# Patient Record
Sex: Female | Born: 1980
Health system: Southern US, Community
[De-identification: ages and names within clinical notes are randomized; demographics above are authoritative.]

## PROBLEM LIST (undated history)

## (undated) DIAGNOSIS — R102 Pelvic and perineal pain: Secondary | ICD-10-CM

## (undated) DIAGNOSIS — Z973 Presence of spectacles and contact lenses: Secondary | ICD-10-CM

## (undated) DIAGNOSIS — M1990 Unspecified osteoarthritis, unspecified site: Secondary | ICD-10-CM

## (undated) DIAGNOSIS — Z972 Presence of dental prosthetic device (complete) (partial): Secondary | ICD-10-CM

## (undated) HISTORY — PX: TUBAL LIGATION: SHX77

## (undated) HISTORY — PX: DILATION AND CURETTAGE OF UTERUS: SHX78

---

## 2002-11-06 HISTORY — PX: DILATION AND CURETTAGE OF UTERUS: SHX78

## 2002-12-08 ENCOUNTER — Emergency Department (HOSPITAL_COMMUNITY): Admission: EM | Admit: 2002-12-08 | Discharge: 2002-12-08 | Payer: Self-pay | Admitting: Emergency Medicine

## 2002-12-08 ENCOUNTER — Encounter: Payer: Self-pay | Admitting: Emergency Medicine

## 2003-02-06 ENCOUNTER — Encounter: Payer: Self-pay | Admitting: Emergency Medicine

## 2003-02-06 ENCOUNTER — Emergency Department (HOSPITAL_COMMUNITY): Admission: EM | Admit: 2003-02-06 | Discharge: 2003-02-06 | Payer: Self-pay | Admitting: Emergency Medicine

## 2003-02-09 ENCOUNTER — Ambulatory Visit (HOSPITAL_COMMUNITY): Admission: RE | Admit: 2003-02-09 | Discharge: 2003-02-09 | Payer: Self-pay | Admitting: *Deleted

## 2003-03-21 ENCOUNTER — Emergency Department (HOSPITAL_COMMUNITY): Admission: EM | Admit: 2003-03-21 | Discharge: 2003-03-22 | Payer: Self-pay | Admitting: Emergency Medicine

## 2003-03-22 ENCOUNTER — Encounter: Payer: Self-pay | Admitting: Emergency Medicine

## 2003-03-23 ENCOUNTER — Inpatient Hospital Stay (HOSPITAL_COMMUNITY): Admission: AD | Admit: 2003-03-23 | Discharge: 2003-03-23 | Payer: Self-pay | Admitting: Obstetrics and Gynecology

## 2003-03-23 ENCOUNTER — Encounter: Payer: Self-pay | Admitting: Obstetrics and Gynecology

## 2003-03-24 ENCOUNTER — Ambulatory Visit (HOSPITAL_COMMUNITY): Admission: AD | Admit: 2003-03-24 | Discharge: 2003-03-24 | Payer: Self-pay | Admitting: Obstetrics and Gynecology

## 2003-03-24 ENCOUNTER — Encounter (INDEPENDENT_AMBULATORY_CARE_PROVIDER_SITE_OTHER): Payer: Self-pay | Admitting: Specialist

## 2003-05-18 ENCOUNTER — Other Ambulatory Visit: Admission: RE | Admit: 2003-05-18 | Discharge: 2003-05-18 | Payer: Self-pay | Admitting: Obstetrics and Gynecology

## 2004-07-07 ENCOUNTER — Emergency Department (HOSPITAL_COMMUNITY): Admission: EM | Admit: 2004-07-07 | Discharge: 2004-07-07 | Payer: Self-pay | Admitting: Emergency Medicine

## 2004-09-06 ENCOUNTER — Emergency Department (HOSPITAL_COMMUNITY): Admission: EM | Admit: 2004-09-06 | Discharge: 2004-09-06 | Payer: Self-pay | Admitting: Emergency Medicine

## 2004-09-10 ENCOUNTER — Ambulatory Visit (HOSPITAL_COMMUNITY): Admission: RE | Admit: 2004-09-10 | Discharge: 2004-09-10 | Payer: Self-pay | Admitting: Obstetrics and Gynecology

## 2004-10-20 ENCOUNTER — Inpatient Hospital Stay (HOSPITAL_COMMUNITY): Admission: AD | Admit: 2004-10-20 | Discharge: 2004-10-20 | Payer: Self-pay | Admitting: Obstetrics and Gynecology

## 2004-11-24 ENCOUNTER — Other Ambulatory Visit: Admission: RE | Admit: 2004-11-24 | Discharge: 2004-11-24 | Payer: Self-pay | Admitting: Obstetrics and Gynecology

## 2004-12-28 ENCOUNTER — Inpatient Hospital Stay (HOSPITAL_COMMUNITY): Admission: AD | Admit: 2004-12-28 | Discharge: 2004-12-28 | Payer: Self-pay | Admitting: Obstetrics and Gynecology

## 2005-02-28 ENCOUNTER — Encounter: Admission: RE | Admit: 2005-02-28 | Discharge: 2005-02-28 | Payer: Self-pay | Admitting: Obstetrics and Gynecology

## 2005-03-22 ENCOUNTER — Inpatient Hospital Stay (HOSPITAL_COMMUNITY): Admission: AD | Admit: 2005-03-22 | Discharge: 2005-03-22 | Payer: Self-pay | Admitting: Obstetrics and Gynecology

## 2005-04-28 ENCOUNTER — Inpatient Hospital Stay (HOSPITAL_COMMUNITY): Admission: AD | Admit: 2005-04-28 | Discharge: 2005-04-28 | Payer: Self-pay | Admitting: *Deleted

## 2005-05-05 ENCOUNTER — Inpatient Hospital Stay (HOSPITAL_COMMUNITY): Admission: AD | Admit: 2005-05-05 | Discharge: 2005-05-05 | Payer: Self-pay | Admitting: Obstetrics and Gynecology

## 2005-05-08 ENCOUNTER — Inpatient Hospital Stay (HOSPITAL_COMMUNITY): Admission: AD | Admit: 2005-05-08 | Discharge: 2005-05-12 | Payer: Self-pay | Admitting: Obstetrics and Gynecology

## 2005-05-09 ENCOUNTER — Encounter (INDEPENDENT_AMBULATORY_CARE_PROVIDER_SITE_OTHER): Payer: Self-pay | Admitting: Specialist

## 2006-03-02 ENCOUNTER — Emergency Department (HOSPITAL_COMMUNITY): Admission: EM | Admit: 2006-03-02 | Discharge: 2006-03-02 | Payer: Self-pay | Admitting: Emergency Medicine

## 2006-04-08 ENCOUNTER — Inpatient Hospital Stay (HOSPITAL_COMMUNITY): Admission: AD | Admit: 2006-04-08 | Discharge: 2006-04-08 | Payer: Self-pay | Admitting: Obstetrics and Gynecology

## 2006-09-07 ENCOUNTER — Emergency Department (HOSPITAL_COMMUNITY): Admission: EM | Admit: 2006-09-07 | Discharge: 2006-09-07 | Payer: Self-pay | Admitting: Emergency Medicine

## 2006-09-17 ENCOUNTER — Emergency Department (HOSPITAL_COMMUNITY): Admission: EM | Admit: 2006-09-17 | Discharge: 2006-09-17 | Payer: Self-pay | Admitting: Emergency Medicine

## 2007-03-07 ENCOUNTER — Emergency Department (HOSPITAL_COMMUNITY): Admission: EM | Admit: 2007-03-07 | Discharge: 2007-03-07 | Payer: Self-pay | Admitting: Emergency Medicine

## 2007-03-30 ENCOUNTER — Emergency Department (HOSPITAL_COMMUNITY): Admission: EM | Admit: 2007-03-30 | Discharge: 2007-03-30 | Payer: Self-pay | Admitting: Emergency Medicine

## 2007-11-23 ENCOUNTER — Inpatient Hospital Stay (HOSPITAL_COMMUNITY): Admission: EM | Admit: 2007-11-23 | Discharge: 2007-11-25 | Payer: Self-pay | Admitting: Psychiatry

## 2007-11-25 ENCOUNTER — Ambulatory Visit: Payer: Self-pay | Admitting: Psychiatry

## 2009-06-22 ENCOUNTER — Encounter: Admission: RE | Admit: 2009-06-22 | Discharge: 2009-06-22 | Payer: Self-pay | Admitting: Obstetrics and Gynecology

## 2009-11-06 HISTORY — PX: TUBAL LIGATION: SHX77

## 2009-11-29 ENCOUNTER — Inpatient Hospital Stay (HOSPITAL_COMMUNITY): Admission: RE | Admit: 2009-11-29 | Discharge: 2009-12-01 | Payer: Self-pay | Admitting: Obstetrics and Gynecology

## 2009-11-29 ENCOUNTER — Encounter (INDEPENDENT_AMBULATORY_CARE_PROVIDER_SITE_OTHER): Payer: Self-pay | Admitting: Obstetrics and Gynecology

## 2011-01-22 LAB — BASIC METABOLIC PANEL
BUN: 6 mg/dL (ref 6–23)
CO2: 21 mEq/L (ref 19–32)
Calcium: 8.6 mg/dL (ref 8.4–10.5)
Chloride: 105 mEq/L (ref 96–112)
Creatinine, Ser: 0.65 mg/dL (ref 0.4–1.2)
GFR calc Af Amer: >60 mL/min (ref >60)
GFR calc non Af Amer: >60 mL/min (ref >60)
Glucose, Bld: 77 mg/dL (ref 70–99)
Potassium: 3.9 mEq/L (ref 3.5–5.1)
Sodium: 133 mEq/L — ABNORMAL LOW (ref 135–145)

## 2011-01-22 LAB — CBC
HCT: 29.8 % — ABNORMAL LOW (ref 36.0–46.0)
HCT: 34.1 % — ABNORMAL LOW (ref 36.0–46.0)
Hemoglobin: 11.4 g/dL — ABNORMAL LOW (ref 12.0–15.0)
Hemoglobin: 9.9 g/dL — ABNORMAL LOW (ref 12.0–15.0)
MCHC: 33.1 g/dL (ref 30.0–36.0)
MCHC: 33.3 g/dL (ref 30.0–36.0)
MCV: 89 fL (ref 78.0–100.0)
MCV: 89.1 fL (ref 78.0–100.0)
Platelets: 229 10*3/uL (ref 150–400)
Platelets: 267 10*3/uL (ref 150–400)
RBC: 3.34 MIL/uL — ABNORMAL LOW (ref 3.87–5.11)
RBC: 3.83 MIL/uL — ABNORMAL LOW (ref 3.87–5.11)
RDW: 14.2 % (ref 11.5–15.5)
RDW: 14.5 % (ref 11.5–15.5)
WBC: 11.1 10*3/uL — ABNORMAL HIGH (ref 4.0–10.5)
WBC: 9.3 10*3/uL (ref 4.0–10.5)

## 2011-01-22 LAB — CCBB MATERNAL DONOR DRAW

## 2011-01-22 LAB — RPR: RPR Ser Ql: NONREACTIVE

## 2011-01-22 LAB — GLUCOSE, CAPILLARY
Glucose-Capillary: 106 mg/dL — ABNORMAL HIGH (ref 70–99)
Glucose-Capillary: 89 mg/dL (ref 70–99)

## 2011-03-24 NOTE — Op Note (Signed)
Linda Hopkins, Linda Hopkins                          ACCOUNT NO.:  [id number]   MEDICAL RECORD NO.:  [id number]                   PATIENT TYPE:  AMB   LOCATION:  DFTL                                 [facility]ILITY:  [facility]   PHYSICIAN:  Osborn Coho, M.D.                DATE OF BIRTH:  06-01-1981   DATE OF PROCEDURE:  03/24/2003  DATE OF DISCHARGE:                                 OPERATIVE REPORT   PREOPERATIVE DIAGNOSIS:  Missed abortion.   POSTOPERATIVE DIAGNOSIS:  Missed abortion.   PROCEDURE:  Suction dilation and curettage.   ANESTHESIA:  MAC with paracervical block (2% lidocaine).   SURGEON:  Osborn Coho, M.D.   FLUIDS REPLACED:  1200 mL.   ESTIMATED BLOOD LOSS:  Minimal, less than 10 mL.   URINE OUTPUT:  Approximately 200 mL via straight catheterization prior to  procedure.   PATHOLOGY:  Significant products of conception sent.   COMPLICATIONS:  None.   DESCRIPTION OF PROCEDURE:  The patient was taken to the operating room after  the risks, benefits, and alternatives were discussed with the patient, the  patient verbalized understanding, and consent signed and witnessed.  The  patient was given Hopkins MAC per anesthesia and placed in the dorsal lithotomy  position and prepped and draped in the normal sterile fashion.  Lidocaine 2%  10 mL was administered for paracervical block while the anterior lip of the  cervix was grasped with Hopkins single-tooth tenaculum.  The cervix was dilated  for passage of Hopkins size 7 suction curette.  Prior to placement of the  speculum, an exam under anesthesia was performed and the uterus was noted to  be approximately seven to eight weeks' size.  After dilating the cervix, the  uterus was sounded to approximately 8 cm.  The size 7 suction curette was  placed in the uterus and suction curettage performed with significant amount  of products of conception returning.  Suction curettage was performed until  minimal tissue returned.  Sharp curettage was then  performed until Hopkins gritty  texture was noted.  Suction curettage was performed once again to remove any  remaining debris.  The count was correct.  Instruments were removed.  The  tenaculum sites were hemostatic.  The patient tolerated the procedure well  and was returned to the recovery room in stable condition.                                               Osborn Coho, M.D.    AR/MEDQ  D:  03/24/2003  T:  03/24/2003  Job:  [id number]

## 2011-03-24 NOTE — Op Note (Signed)
Linda Hopkins, Linda Hopkins                ACCOUNT NO.:  [id number]   MEDICAL RECORD NO.:  [id number]          PATIENT TYPE:  INP   LOCATION:  9142                          [facility]ILITY:  [facility]   PHYSICIAN:  Malachi Pro. Ambrose Mantle, M.D. DATE OF BIRTH:  1981/04/29   DATE OF PROCEDURE:  05/09/2005  DATE OF DISCHARGE:                                 OPERATIVE REPORT   PREOPERATIVE DIAGNOSES:  1.  Intrauterine pregnancy at 39+ weeks.  2.  Failure to progress in labor.  3.  Repetitive late decelerations.  4.  Probable chorioamnionitis.   POSTOPERATIVE DIAGNOSES:  1.  Intrauterine pregnancy at 39+ weeks.  2.  Failure to progress in labor.  3.  Repetitive late decelerations.  4.  Probable chorioamnionitis.   OPERATION:  Low transverse cervical C-section.   OPERATOR:  Malachi Pro. Ambrose Mantle, M.D.   ANESTHESIA:  Epidural.   The patient was brought to the operating room.  Fetal heart rate was in the  170s.  There were no decelerations in the operating room.  The epidural  anesthetic was boosted.  The fetal scalp electrode was removed.  A Foley  catheter was indwelling.  The abdomen was prepped with Betadine solution and  draped as a sterile field.  The patient had a tattoo on her lower abdomen  and I told her that I would make the incision slightly higher than the  tattoo.  The abdomen was then draped as a sterile field.  Anesthesia was  confirmed.  And the incision was made through the skin in a transverse  fashion, just above the tattoo kitty.  The incision was carried in layers  through the skin, subcutaneous tissue, and fascia.  The fascia was then  separated from the rectus muscles superiorly and inferiorly.  The rectus  muscle was splint in the midline.  The peritoneum was opened vertically.  An  incision was made into the lower uterine segment peritoneum and extended  laterally and then I made a small incision in the lower uterine segment with  the knife and then entered the amniotic cavity with my  finger.  Enlarged the  incision by pulling superiorly and inferiorly on the uterine incision.  The  infant easily delivered with fundal pressure.  The vertex delivered.  The  nose and pharynx were suctioned with a bulb and the remainder of the baby  was delivered easily.  Cord was clamped.  The infant was given to Dr. Alison Murray  who was in attendance.  The infant was a female, 7 pounds 5 ounces, Apgar's of  9 and 9 at 1 and 5 minutes.  A cord blood sample was obtained in case the pH  was necessary.  Routine cord blood studies were obtained.  The placenta was  removed.  The inside of the uterus was inspected and found to be free of any  debris.  The cervix was already dilated.  I closed the uterus in two layers  using a running lock suture of 0 Vicryl in the first layer, non-locking  suture with the same material on the second layer, and then a  couple of  extra figure-of-8 sutures were required for hemostasis.  Liberal irrigation  confirmed hemostasis.  The uterus, tubes, and ovaries looked normal.  The  gutters were blotted free of blood.  The peritoneum and rectus muscle were  re-approximated with interrupted sutures of 0 Vicryl in one layer.  The  fascia was then closed with two running sutures of 0 Vicryl.  The subcu space was irrigated and closed with a running 3-0 Vicryl and the  skin was closed with automatic staples.  The patient seemed to tolerate the  procedure well.  Blood loss was about 1,000 cc.  Sponge and needle counts  were correct.  And she was returned to recovery in satisfactory condition.       TFH/MEDQ  D:  05/10/2005  T:  05/10/2005  Job:  [id number]

## 2011-03-24 NOTE — Discharge Summary (Signed)
Linda Hopkins, Linda Hopkins                ACCOUNT NO.:  [id number]   MEDICAL RECORD NO.:  [id number]          PATIENT TYPE:  INP   LOCATION:  9142                          [facility]   PHYSICIAN:  Malachi Pro. Ambrose Mantle, M.D. DATE OF BIRTH:  1981/09/24   DATE OF ADMISSION:  05/08/2005  DATE OF DISCHARGE:                                 DISCHARGE SUMMARY   HOSPITAL COURSE:  A 30 year old white single female para 0 gravida 1  admitted for induction of labor because of her own insistence that she was  not going to leave the hospital without her baby. The patient's history and  physical and progress up to her C-section are detailed in the dictated  History and Physical. The patient underwent a C-section beginning on May 09, 2005 and ending on May 10, 2005 for a combination of failure to progress,  repetitive late decelerations, and probable chorioamnionitis. The procedure  was performed by Dr. Ambrose Mantle under epidural anesthesia, blood loss about 1000  mL; a female infant 7 pounds 5 ounces was delivered with Apgars of 9 at one  and 9 at five minutes. Postpartum, the patient did extremely well and was  discharged on the morning of the second or third postoperative day. She was  discharged 2 days after the surgery completed, 3 days after the baby was  born. Her staples were removed, strips were applied. The patient had  received Unasyn until she had been afebrile for about 32 hours. Her initial  hemoglobin was 12.8; hematocrit 38.8; white count 12,100; platelet count  335,000. Follow-up hemoglobin 10.1. RPR was nonreactive.   FINAL DIAGNOSES:  1.  Intrauterine pregnancy at 39+ weeks delivered vertex by cesarean      section.  2.  Failure to progress in labor.  3.  Repetitive late decelerations of the fetal heart rate.  4.  Probable chorioamnionitis.   OPERATION:  Low transverse cervical cesarean section.   FINAL CONDITION:  Improved.   INSTRUCTIONS:  Include our regular discharge instruction  booklet. The  patient is advised to return to the office in 10-14 days for follow-up  examination. The patient is given a prescription for Percocet 5/325 #24  tablets one q.4-6h. as needed for pain.       TFH/MEDQ  D:  05/12/2005  T:  05/12/2005  Job:  [id number]
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
## 2012-03-03 ENCOUNTER — Encounter (HOSPITAL_COMMUNITY): Payer: Self-pay | Admitting: Emergency Medicine

## 2012-03-03 ENCOUNTER — Emergency Department (HOSPITAL_COMMUNITY)
Admission: EM | Admit: 2012-03-03 | Discharge: 2012-03-04 | Disposition: A | Discharge status: Home / Self Care | Payer: Self-pay | Attending: Emergency Medicine | Admitting: Emergency Medicine

## 2012-03-03 DIAGNOSIS — R10819 Abdominal tenderness, unspecified site: Secondary | ICD-10-CM | POA: Insufficient documentation

## 2012-03-03 DIAGNOSIS — IMO0002 Reserved for concepts with insufficient information to code with codable children: Secondary | ICD-10-CM | POA: Insufficient documentation

## 2012-03-03 DIAGNOSIS — R109 Unspecified abdominal pain: Secondary | ICD-10-CM | POA: Insufficient documentation

## 2012-03-03 DIAGNOSIS — F17200 Nicotine dependence, unspecified, uncomplicated: Secondary | ICD-10-CM | POA: Insufficient documentation

## 2012-03-03 DIAGNOSIS — N949 Unspecified condition associated with female genital organs and menstrual cycle: Secondary | ICD-10-CM | POA: Insufficient documentation

## 2012-03-03 DIAGNOSIS — N72 Inflammatory disease of cervix uteri: Secondary | ICD-10-CM | POA: Insufficient documentation

## 2012-03-03 DIAGNOSIS — R110 Nausea: Secondary | ICD-10-CM | POA: Insufficient documentation

## 2012-03-03 LAB — URINALYSIS, ROUTINE W REFLEX MICROSCOPIC
Bilirubin Urine: NEGATIVE
Glucose, UA: NEGATIVE mg/dL
Hgb urine dipstick: NEGATIVE
Ketones, ur: NEGATIVE mg/dL
Leukocytes, UA: NEGATIVE
Nitrite: NEGATIVE
Protein, ur: NEGATIVE mg/dL
Specific Gravity, Urine: 1.029 (ref 1.005–1.030)
Urobilinogen, UA: 1 mg/dL (ref 0.0–1.0)
pH: 6.5 (ref 5.0–8.0)

## 2012-03-03 LAB — WET PREP, GENITAL

## 2012-03-03 LAB — POCT PREGNANCY, URINE: Preg Test, Ur: NEGATIVE

## 2012-03-03 MED ORDER — AZITHROMYCIN 250 MG PO TABS
1000.0000 mg | ORAL_TABLET | Freq: Once | ORAL | Status: AC
  Administered 2012-03-03: 1000 mg via ORAL
  Filled 2012-03-03: qty 4

## 2012-03-03 MED ORDER — LIDOCAINE HCL 1 % IJ SOLN
INTRAMUSCULAR | Status: AC
  Administered 2012-03-03: 20 mL
  Filled 2012-03-03: qty 20

## 2012-03-03 MED ORDER — DOXYCYCLINE HYCLATE 100 MG PO CAPS: 100.0000 mg | ORAL_CAPSULE | Freq: Two times a day (BID) | ORAL | Status: AC

## 2012-03-03 MED ORDER — LIDOCAINE HCL 2 % IJ SOLN
INTRAMUSCULAR | Status: AC
  Filled 2012-03-03: qty 1

## 2012-03-03 MED ORDER — CEFTRIAXONE SODIUM 250 MG IJ SOLR
250.0000 mg | Freq: Once | INTRAMUSCULAR | Status: AC
  Administered 2012-03-03: 250 mg via INTRAMUSCULAR
  Filled 2012-03-03: qty 250

## 2012-03-03 MED ORDER — FLUCONAZOLE 150 MG PO TABS
150.0000 mg | ORAL_TABLET | Freq: Once | ORAL | Status: AC
  Administered 2012-03-03: 150 mg via ORAL
  Filled 2012-03-03: qty 1

## 2012-03-03 NOTE — ED Provider Notes (Signed)
History     CSN: [id number]  Arrival date & time 03/03/12  1952   First MD Initiated Contact with Patient 03/03/12 2031      Chief Complaint  Patient presents with  . Abdominal Pain    HPI  History provided by the patient. Patient is a 31 year old female with prior history of cesarean section, tubal ligation in the brain cyst who presents with complaints of bilateral lower abdomen and pelvic pains for the past 2 weeks. Pain came on gradually and has been waxing and waning. Recently pain is more persistent and intense. Painful similar to prior symptoms of ovarian cyst. Patient's last menstrual period was 02/20/2012 and normal. Patient also reports having associated clear or thick malodorous vaginal discharge, and dyspareunia. She denies any vaginal bleeding. No dysuria, hematuria, urinary frequency. No flank pains, fever, chills, sweats or vomiting. Patient has taken Aleve and Midol for her symptoms without significant relief. She denies any other aggravating or alleviating factors.    History reviewed. No pertinent past medical history.  Past Surgical History  Procedure Date  . Cesarean section     x 2  . Dilation and curettage of uterus   . Tubal ligation     No family history on file.  History  Substance Use Topics  . Smoking status: Current Some Day Smoker  . Smokeless tobacco: Not on file  . Alcohol Use: No    OB History    Grav Para Term Preterm Abortions TAB SAB Ect Mult Living                  Review of Systems  Constitutional: Negative for fever, chills and appetite change.  Gastrointestinal: Positive for nausea and abdominal pain. Negative for vomiting, diarrhea and constipation.  Genitourinary: Positive for vaginal discharge, vaginal pain and pelvic pain. Negative for dysuria, frequency, hematuria, flank pain, vaginal bleeding and menstrual problem.    Allergies  Hydrocodone  Home Medications   Current Outpatient Rx  Name Route Sig Dispense Refill  .  IBUPROFEN 200 MG PO TABS Oral Take 200 mg by mouth every 6 (six) hours as needed. For pain relief      BP 109/75  Pulse 97  Temp(Src) 98.8 F (37.1 C) (Oral)  Resp 16  Ht 5\' 4"  (1.626 m)  Wt 160 lb (72.576 kg)  BMI 27.46 kg/m2  SpO2 100%  LMP 02/20/2012  Physical Exam  Nursing note and vitals reviewed. Constitutional: She is oriented to person, place, and time. She appears well-developed and well-nourished. No distress.  HENT:  Head: Normocephalic and atraumatic.  Cardiovascular: Normal rate and regular rhythm.   Pulmonary/Chest: Effort normal and breath sounds normal.  Abdominal: Soft. There is tenderness in the right lower quadrant, suprapubic area and left lower quadrant. There is no rebound, no guarding, no CVA tenderness and no tenderness at McBurney's point.  Genitourinary: Cervix exhibits motion tenderness, discharge and friability. Right adnexum displays tenderness. Right adnexum displays no mass. Left adnexum displays tenderness. Left adnexum displays no mass.       Chaperone was present. Pain over the cervix with slight friability. Mild clear discharge. Slight dryness the vaginal walls with erythema. Small amounts of thick or white discharge. Mild adnexa tenderness.  Neurological: She is alert and oriented to person, place, and time.  Skin: Skin is warm and dry. No rash noted.  Psychiatric: She has a normal mood and affect. Her behavior is normal.    ED Course  Procedures   Results for orders  placed during the hospital encounter of 03/03/12  URINALYSIS, ROUTINE W REFLEX MICROSCOPIC      Component Value Range   Color, Urine YELLOW  YELLOW    APPearance CLOUDY (*) CLEAR    Specific Gravity, Urine 1.029  1.005 - 1.030    pH 6.5  5.0 - 8.0    Glucose, UA NEGATIVE  NEGATIVE (mg/dL)   Hgb urine dipstick NEGATIVE  NEGATIVE    Bilirubin Urine NEGATIVE  NEGATIVE    Ketones, ur NEGATIVE  NEGATIVE (mg/dL)   Protein, ur NEGATIVE  NEGATIVE (mg/dL)   Urobilinogen, UA 1.0  0.0  - 1.0 (mg/dL)   Nitrite NEGATIVE  NEGATIVE    Leukocytes, UA NEGATIVE  NEGATIVE   POCT PREGNANCY, URINE      Component Value Range   Preg Test, Ur NEGATIVE  NEGATIVE   WET PREP, GENITAL      Component Value Range   Yeast Wet Prep HPF POC NONE SEEN  NONE SEEN    Trich, Wet Prep NONE SEEN  NONE SEEN    Clue Cells Wet Prep HPF POC FEW (*) NONE SEEN    WBC, Wet Prep HPF POC FEW (*) NONE SEEN        1. Cervicitis       MDM  8:30 PM patient seen and evaluated. Patient no acute distress.        Angus Seller, [place] 03/04/12 [phone]

## 2012-03-03 NOTE — ED Notes (Signed)
Pt c/o lower abd pain, low back pain and pain with intercourse x 2 weeks. Worse today.Clear, malodorous discharge, denies dysuria,denies hematuria.

## 2012-03-03 NOTE — Discharge Instructions (Signed)
You were seen and treated for possible yeast infection today. Your providers were also concerned for infection and irritation of your cervix. You've been prescribed additional antibiotics to take to help treat your symptoms. Please followup with an OB/GYN provider for continued evaluation and treatment. Return to emergency room for any worsening symptoms, fever, chills, persistent nausea vomiting.   Cervicitis Cervicitis is a soreness and swelling (inflammation) of the cervix. Your cervix is located at the bottom of your uterus which opens up to the vagina.  CAUSES   Sexually transmitted infections (STIs).   Allergic reaction.   Medicines or birth control devices that are put in the vagina.   Injury to the cervix.   Bacterial infections.  SYMPTOMS  There may be no symptoms. If symptoms occur, they may include:  Grey, white, yellow, or bad smelling vaginal discharge.   Pain or itching of the area outside the vagina.   Painful sexual intercourse.   Lower abdominal or lower back pain, especially during intercourse.   Frequent urination.   Abnormal vaginal bleeding between periods, after sexual intercourse, or after menopause.   Pressure or a heavy feeling in the pelvis.  DIAGNOSIS  Diagnosis is made after a pelvic exam. Other tests may include:  Examination of any discharge under a microscope (wet prep).   A Pap test.  TREATMENT  Treatment will depend on the cause of cervicitis. If it is caused by an STI, both you and your partner will need to be treated. Antibiotic medicines will be given. HOME CARE INSTRUCTIONS   Do not have sexual intercourse until your caregiver says it is okay.   Do not have sexual intercourse until your partner has been treated if your cervicitis is caused by an STI.   Take your antibiotics as directed. Finish them even if you start to feel better.  SEEK IMMEDIATE MEDICAL CARE IF:   Your symptoms come back.   You have a fever.   You experience  any problems that may be related to the medicine you are taking.  MAKE SURE YOU:   Understand these instructions.   Will watch your condition.   Will get help right away if you are not doing well or get worse.  Document Released: 10/23/2005 Document Revised: 10/12/2011 Document Reviewed: 05/22/2011 [facility] Patient Information 2012 [place], [place].   RESOURCE GUIDE  Dental Problems  Patients with Medicaid: [facility] [phone] [address]                                           [phone] [address] Phone:  [phone]                                                  Phone:  [phone]  If unable to pay or uninsured, contact:  [facility] or [facility]. to become qualified for the adult dental clinic.  Chronic Pain Problems Contact [facility]  [phone] Patients need to be referred by their primary care doctor.  Insufficient Money for Medicine Contact United Way:  call "211" or [facility] [phone].  No Primary Care Doctor Call [facility]  [phone] Other agencies that provide inexpensive medical care    [facility]  [phone]    [facility]  [phone]    [facility]  [phone]    [facility]  [phone]    [facility]  717-830-6232    [facility]  [phone]  Psychological Services [facility] Behavioral Health  [phone] [facility] Services  [phone] [facility] Mental Health   [phone] (emergency services ([phone])818-2133)  Substance Abuse Resources [facility]  [phone] [facility] [phone] [facility] [place] [phone] Floydene Flock [phone] Residential & Outpatient Substance Abuse Program  267-699-9104  Abuse/Neglect [facility] Child Abuse Hotline (905)250-4827 [facility] Child Abuse Hotline (208)553-1463 (After Hours)  Emergency Shelter [facility]  (737)323-8896  Maternity Homes [facility] (604)743-6656 [facility] (254)[phone]  MRSA Hotline #:   240-495-5473    [facility] Resources  [facility]     [employer]                          [facility] Dept. [address]                        [address]      [address]  Blondell Reveal Phone:  [phone]                                   Phone:  [phone]                 Phone:  [phone]  [facility] Phone:  [phone]  [place] Child Abuse Hotline 303-720-0469 [phone] (After Hours)

## 2012-03-04 LAB — GC/CHLAMYDIA PROBE AMP, GENITAL: GC Probe Amp, Genital: NEGATIVE

## 2012-03-04 NOTE — ED Provider Notes (Signed)
Medical screening examination/treatment/procedure(s) were performed by non-physician practitioner and as supervising physician I was immediately available for consultation/collaboration. Devoria Albe, MD, FACEP   Ward Givens, MD 03/04/12 [phone]

## 2012-03-04 NOTE — ED Notes (Signed)
Pt very upset we were not giving her scripts for free. Explained antibiotic is on $4 list. Pt states she can not afford med. Advised pt to try to follow up with SS for assistance.

## 2012-04-04 ENCOUNTER — Ambulatory Visit (INDEPENDENT_AMBULATORY_CARE_PROVIDER_SITE_OTHER): Payer: Self-pay | Admitting: Obstetrics & Gynecology

## 2012-04-04 ENCOUNTER — Encounter: Payer: Self-pay | Admitting: Obstetrics & Gynecology

## 2012-04-04 VITALS — BP 106/74 | HR 90 | Temp 98.7°F | Ht 63.0 in | Wt 142.0 lb

## 2012-04-04 DIAGNOSIS — N949 Unspecified condition associated with female genital organs and menstrual cycle: Secondary | ICD-10-CM

## 2012-04-04 DIAGNOSIS — N898 Other specified noninflammatory disorders of vagina: Secondary | ICD-10-CM

## 2012-04-04 DIAGNOSIS — R102 Pelvic and perineal pain: Secondary | ICD-10-CM | POA: Insufficient documentation

## 2012-04-04 DIAGNOSIS — A499 Bacterial infection, unspecified: Secondary | ICD-10-CM

## 2012-04-04 DIAGNOSIS — N760 Acute vaginitis: Secondary | ICD-10-CM

## 2012-04-04 DIAGNOSIS — B9689 Other specified bacterial agents as the cause of diseases classified elsewhere: Secondary | ICD-10-CM | POA: Insufficient documentation

## 2012-04-04 MED ORDER — METRONIDAZOLE 500 MG PO TABS: 500.0000 mg | ORAL_TABLET | Freq: Two times a day (BID) | ORAL | Status: AC

## 2012-04-04 MED ORDER — DICLOFENAC SODIUM 75 MG PO TBEC: 75.0000 mg | DELAYED_RELEASE_TABLET | Freq: Two times a day (BID) | ORAL | Status: DC

## 2012-04-04 NOTE — Assessment & Plan Note (Signed)
BV: Repeat wet prep. Treat with flagyl.

## 2012-04-04 NOTE — Patient Instructions (Signed)
Cheron,  Thank you very much for coming in today. Please do the following: 1. Stop motrin 2. Start diclofenac for pain 3. Take flagyl for BV (do not mix with alcohol) 4. F/u in 3 weeks to review ultrasound and plan of care.   Drs. Eudell Julian/Arnold

## 2012-04-04 NOTE — Assessment & Plan Note (Signed)
Pelvic pain: occuring daily, x 2 months, mild improvement with motrin. Pain concerning for endometriosis. History consistent with dysmenorrhea.  Plan: -pelvic ultrasound -stop motrin, start diclofenac -f/u in 3 weeks -if ultrasound normal, endometriosis still very likely treatment options include depo/OCPs and laparoscopy.

## 2012-04-04 NOTE — Progress Notes (Signed)
Subjective:     Patient ID: Linda Hopkins, female   DOB: [birth year], 31 y.o.   MRN: [id number]  HPI 31 yo F presents as an ED follow up with a complaint of 2 months of daily pelvic pain. She was evaluated for the pain in the ED on 03/03/12 and was treated for cystitis with doxycyline, azithromycin, rocephin and diflucan. Her pain has persisted despite of treatment. She reports bilateral pelvic pain, low back pain and pain with intercourse in all positions. She denies pelvic trauma, recent change in sexual partners, vomiting, fever, chills and weight loss.   Regarding menses, periods are fairly regular occuring monthly, last 5 days, painful associated with heavy flow and strong cramps. Menarche age 75.   OB History    Grav Para Term Preterm Abortions TAB SAB Ect Mult Living   3 2 2  1 1    2      Past Surgical History  Procedure Date  . Cesarean section     x 2  . Dilation and curettage of uterus   . Tubal ligation    Occasional (non-daily) smoker.   Review of Systems As per HPI     Objective:   Physical Exam BP 106/74  Pulse 90  Temp(Src) 98.7 F (37.1 C) (Oral)  Ht 5\' 3"  (1.6 m)  Wt 142 lb (64.411 kg)  BMI 25.15 kg/m2  LMP 03/25/2012 General appearance: alert, cooperative and no distress Abdomen: soft, non-tender; bowel sounds normal; no masses,  no organomegaly Pelvic: cervix normal in appearance, external genitalia normal, no cervical motion tenderness, rectovaginal septum normal, uterus normal size, shape, and consistency, vagina normal with scant thick white discharge,  L> R adnexal tenderness and uterine tenderness.     Assessment and Plan:   Pelvic pain: occuring daily, x 2 months, mild improvement with motrin. Pain concerning for endometriosis. History consistent with dysmenorrhea.  Plan: -pelvic ultrasound -stop motrin, start diclofenac -f/u in 3 weeks -if ultrasound normal, endometriosis still very likely treatment options include depo/OCPs and laparoscopy.    BV: Repeat wet prep. Treat with flagyl.

## 2012-04-05 LAB — WET PREP, GENITAL
Clue Cells Wet Prep HPF POC: NONE SEEN
Trich, Wet Prep: NONE SEEN

## 2012-04-10 ENCOUNTER — Ambulatory Visit (HOSPITAL_COMMUNITY)
Admission: RE | Admit: 2012-04-10 | Discharge: 2012-04-10 | Disposition: A | Discharge status: Home / Self Care | Payer: Self-pay | Source: Ambulatory Visit | Attending: Obstetrics & Gynecology | Admitting: Obstetrics & Gynecology

## 2012-04-10 DIAGNOSIS — R102 Pelvic and perineal pain: Secondary | ICD-10-CM

## 2012-04-10 DIAGNOSIS — N949 Unspecified condition associated with female genital organs and menstrual cycle: Secondary | ICD-10-CM | POA: Insufficient documentation

## 2012-05-13 ENCOUNTER — Ambulatory Visit: Payer: Self-pay | Admitting: Physician Assistant

## 2012-05-24 ENCOUNTER — Ambulatory Visit: Payer: Self-pay | Admitting: Obstetrics & Gynecology

## 2012-05-27 ENCOUNTER — Encounter: Payer: Self-pay | Admitting: Obstetrics & Gynecology

## 2012-05-27 ENCOUNTER — Ambulatory Visit (INDEPENDENT_AMBULATORY_CARE_PROVIDER_SITE_OTHER): Payer: Self-pay | Admitting: Obstetrics & Gynecology

## 2012-05-27 VITALS — BP 102/73 | HR 91 | Temp 98.7°F | Ht 65.0 in | Wt 142.6 lb

## 2012-05-27 DIAGNOSIS — N946 Dysmenorrhea, unspecified: Secondary | ICD-10-CM

## 2012-05-27 DIAGNOSIS — R102 Pelvic and perineal pain: Secondary | ICD-10-CM

## 2012-05-27 DIAGNOSIS — N949 Unspecified condition associated with female genital organs and menstrual cycle: Secondary | ICD-10-CM

## 2012-05-27 MED ORDER — NORGESTIM-ETH ESTRAD TRIPHASIC 0.18/0.215/0.25 MG-35 MCG PO TABS: 1.0000 | ORAL_TABLET | Freq: Every day | ORAL | Status: DC

## 2012-05-27 MED ORDER — DICLOFENAC SODIUM 75 MG PO TBEC: 75.0000 mg | DELAYED_RELEASE_TABLET | Freq: Two times a day (BID) | ORAL | Status: AC

## 2012-05-27 NOTE — Patient Instructions (Signed)
Pelvic Pain in Women, Generic  Pelvic pain may be constant or come and go. It may be mild or severe. It is important to tell your caregiver exactly where the pain is located, when and how it occurs, and if it is related to your menstrual periods or stress. We have not found a definite cause for your pelvic pain today and you may need follow-up testing and examination.  CAUSES    Sexually transmitted diseases (STDS) cause pelvic inflammatory disease (PID). This is one of the most common causes of pelvic pain. It is an infection of the female sexual organs.   Endometriosis - This is a condition where some of the inside lining of the uterus is growing in the pelvis and abdomen outside the uterus. Along with (chronic) pain, this can cause infertility.   Tubal pregnancy - This is a serious condition where the pregnancy has occurred in a fallopian tube. Rupture of the tube can bleed heavily and cause death if it is not found in time.   Interstitial cystitis is an inflammation of the bladder that causes pelvic pain. People with severe cases of IC may urinate as many as 60 times a day.   Fibroids: A small percentage of women have uterine fibroids (non-cancerous smooth muscle growths in the uterus). Fibroids do not always cause pain.   Fibromyalgia is a disorder with symptoms of widespread muscle pain, fatigue and multiple tender points on the body.   Dysmenorrhea is painful menstrual periods.   Mittlesmertz is pain with ovulation.   Pelvic congestive syndrome, is engorgement of the pelvic veins just before and during a menstrual period.   Cervical stenosis is when the opening of the cervix is too small and causes pain during menstruation.   Adenomyosis (a type of endometriosis) glands that line the inside of the uterus lying in the muscle layer of the uterus.   Intestinal problems such as irritable bowel syndrome colitis or ileitis.   Appendicitis.   Pelvic cancer. Usually the cancer has been there for awhile  before causing pain.   Bladder infection.   Cysts or ovarian tumors.   Kidney stone.   Psychological factors (stress, sexual abuse or depression).   IUD (intrauterine device).   Prolapse (falling down of the uterus).   Retroflexed uterus - the uterus is tipped too far backwards.   Muscle spasms of the pelvic muscles.   Muscular-skeletal problems of the back (herniated disc).  DIAGNOSIS    Your caregiver may order testing, such as:   Blood tests.   Cultures to test for infection.   Ultrasound.   Looking into the bladder with a metal tube with a light (cystoscopy).   Looking into the pelvis and abdomen with very small incisions through a metal tube with a light (laparoscopy).   Looking into the large intestine with a fibro-optic tube with a light (colonoscopy).   CT scan - a type of X-ray to view the internal organs of the pelvis and abdomen.   MRI - views the pelvic and abdominal organs with a magnetic machine.   Intravenous pyelogram - views the kidneys, ureter and bladder after injecting dye through the vein by X-rays.   Injecting barium into the large intestine to view the intestine with X-rays (barium enema).   Not all test results are available during your visit. If your test results are not back during the visit, make an appointment with your caregiver or the medical facility. It is important for you to follow up   on all of your test results.  TREATMENT   Treatment will depend on the cause of the pain, such as:   Medication, antibiotics, pain medication, muscle relaxants, anti-depression drugs, hormones or birth control pills.   Physical therapy.   Acupuncture.   Psychiatric counseling.   Nerve blocks.   Surgery.  HOME CARE INSTRUCTIONS    Finish all medication as prescribed. Incomplete treatment will put you at risk for sterility and tubal pregnancy if your caregiver feels your pain is caused by an infection.   Rest and eat a balanced diet with plenty of fluids.   If you do have an  infection, your recent sexual partners may need treatment even if they are symptom-free or have a negative culture or evaluation. You also need follow-up to make sure you are no longer infected.   Only take over-the-counter or prescription medicines for pain, discomfort or fever as directed by your caregiver.   Apply warm or cold compresses to the lower abdomen depending on which one helps the pain.   Avoid stressful situations that may cause the pain.   Group therapy is sometimes helpful.   Make sure to follow all instructions. Some of the conditions listed above can have very serious outcomes if you do not take the time to follow-up with your caregiver.  SEEK IMMEDIATE MEDICAL CARE IF:    There is heavier bleeding from the birth canal (vagina).   You develop increasing abdominal pain.   You feel lightheaded or pass out.   An unexplained oral temperature above 102 F (38.9 C) develops.   Any of the problems which brought you to us are getting worse.   You are being physically or sexually abused.   You have painful urination.   You are still having pain four hours after taking prescription pain medication.   You have uncontrolled diarrhea.   You have abnormal vaginal discharge.  Document Released: 09/19/2004 Document Revised: 10/12/2011 Document Reviewed: 10/20/2008  [employer] Patient Information 2012 [employer].

## 2012-05-27 NOTE — Progress Notes (Signed)
Patient ID: Linda Hopkins, female   DOB: [birth year], 31 y.o.   MRN: [id number]  Chief Complaint  Patient presents with  . Follow-up    [place]    HPI Linda Hopkins is a 31 y.o. female.  Long history of bilateral lower quadrant pain and lower back pain. She says it began after her first cesarean section in 2006 and was worse after her repeat cesarean section in 2011. At that time she had a tubal ligation. Over the last few months pain seems to be worse. She has dyspareunia. She has dysmenorrhea. She is urinating more frequently but she believes it's because she states she's been drinking more fluids. No urinary urgency or dysuria. HPI  No past medical history on file.  Past Surgical History  Procedure Date  . Cesarean section     x 2  . Dilation and curettage of uterus   . Tubal ligation     No family history on file.  Social History History  Substance Use Topics  . Smoking status: Former Games developer  . Smokeless tobacco: Not on file  . Alcohol Use: No    Allergies  Allergen Reactions  . Hydrocodone Nausea And Vomiting    Current Outpatient Prescriptions  Medication Sig Dispense Refill  . diclofenac (VOLTAREN) 75 MG EC tablet Take 1 tablet (75 mg total) by mouth 2 (two) times daily.  60 tablet  3  . Norgestimate-Ethinyl Estradiol Triphasic (TRI-SPRINTEC) 0.18/0.215/0.25 MG-35 MCG tablet Take 1 tablet by mouth daily.  1 Package  11  . DISCONTD: Norgestimate-Ethinyl Estradiol Triphasic (TRI-SPRINTEC) 0.18/0.215/0.25 MG-35 MCG tablet Take 1 tablet by mouth daily.  1 Package  11    Review of Systems Review of Systems  Constitutional: Negative for fever.  Gastrointestinal: Negative for nausea, constipation and abdominal distention.  Genitourinary: Positive for pelvic pain and dyspareunia. Negative for vaginal discharge, vaginal pain and menstrual problem.    Blood pressure 102/73, pulse 91, temperature 98.7 F (37.1 C), temperature source Oral, height 5\' 5"  (1.651 m), weight  142 lb 9.6 oz (64.683 kg), last menstrual period 04/10/2012.  Physical Exam Physical Exam  Constitutional: She appears well-developed and well-nourished.  Abdominal: Soft. She exhibits no mass. There is Tenderness: mild bilateral low abdomen.. There is no guarding.  Genitourinary:       deferred  Skin: Skin is warm and dry.  Psychiatric: She has a normal mood and affect. Her behavior is normal.    Data Reviewed  *RADIOLOGY REPORT*  Clinical Data: Pelvic pain. History of 2 previous C-section  surgeries, D&C. LMP 03/25/2012  TRANSABDOMINAL AND TRANSVAGINAL ULTRASOUND OF PELVIS  Technique: Both transabdominal and transvaginal ultrasound  examinations of the pelvis were performed. Transabdominal technique  was performed for global imaging of the pelvis including uterus,  ovaries, adnexal regions, and pelvic cul-de-sac.  Comparison: 05/05/2005  It was necessary to proceed with endovaginal exam following the  transabdominal exam to visualize the endometrial and adnexal  detail.  Findings:  Uterus: .The uterus is 8.8 x 4.1 x 5.2 cm. Uterus is retroflexed  and anteverted. C-section scar is noted.  Endometrium: 13.5 mm, normal in appearance.  Right ovary: 3.1 x 1.6 x 2.5 cm, normal appearance.  Left ovary: 3.1 x 2.3 x 2.4 cm, normal in appearance.  Other findings: No free fluid  IMPRESSION:  1. Normal pelvic ultrasound.  2. C-section scar identified.  Original Report Authenticated By: Patterson Hammersmith, M.D.        Assessment    Pelvic pain, dysmenorrhea  Plan    Voltaren 75 mg by mouth twice a day for pain Tri-Sprintec Return to clinic 3 months       Linda Hopkins 05/27/2012, 5:20 PM

## 2012-06-22 ENCOUNTER — Emergency Department (HOSPITAL_COMMUNITY): Payer: Self-pay

## 2012-06-22 ENCOUNTER — Emergency Department (HOSPITAL_COMMUNITY)
Admission: EM | Admit: 2012-06-22 | Discharge: 2012-06-22 | Disposition: A | Discharge status: Home / Self Care | Payer: Self-pay | Attending: Emergency Medicine | Admitting: Emergency Medicine

## 2012-06-22 ENCOUNTER — Encounter (HOSPITAL_COMMUNITY): Payer: Self-pay | Admitting: Emergency Medicine

## 2012-06-22 DIAGNOSIS — S92919A Unspecified fracture of unspecified toe(s), initial encounter for closed fracture: Secondary | ICD-10-CM | POA: Insufficient documentation

## 2012-06-22 DIAGNOSIS — IMO0002 Reserved for concepts with insufficient information to code with codable children: Secondary | ICD-10-CM | POA: Insufficient documentation

## 2012-06-22 MED ORDER — OXYCODONE-ACETAMINOPHEN 5-325 MG PO TABS: 1.0000 | ORAL_TABLET | ORAL | Status: AC | PRN

## 2012-06-22 NOTE — ED Provider Notes (Signed)
History     CSN: [id number]  Arrival date & time 06/22/12  1128   First MD Initiated Contact with Patient 06/22/12 1201      Chief Complaint  Patient presents with  . Toe Injury    (Consider location/radiation/quality/duration/timing/severity/associated sxs/prior treatment) HPI Comments: Pt presents with right 4th toe pain. Pt states she was getting ready for work when she accidentally hit her foot on the leg of the sofa. Pt was unable to put pressure on her right foot afterward and "hopped" to the car and was driven to the ED. Pain is in the right 4th toe, constant and throbbing in nature, and increases with weight bearing. Pt reports that her toe looks "crooked" as compared to usual. Pt denies any other pain or injury, swelling, weakness, numbness or tingling of the lower extremities.  The history is provided by the patient.    History reviewed. No pertinent past medical history.  Past Surgical History  Procedure Date  . Cesarean section     x 2  . Dilation and curettage of uterus   . Tubal ligation     No family history on file.  History  Substance Use Topics  . Smoking status: Former Games developer  . Smokeless tobacco: Never Used  . Alcohol Use: No    OB History    Grav Para Term Preterm Abortions TAB SAB Ect Mult Living   3 2 2  1 1    2       Review of Systems  Skin: Negative for color change and pallor.  Neurological: Negative for weakness and numbness.    Allergies  Hydrocodone  Home Medications   Current Outpatient Rx  Name Route Sig Dispense Refill  . DICLOFENAC SODIUM 75 MG PO TBEC Oral Take 1 tablet (75 mg total) by mouth 2 (two) times daily. 60 tablet 3  . NORGESTIM-ETH ESTRAD TRIPHASIC 0.18/0.215/0.25 MG-35 MCG PO TABS Oral Take 1 tablet by mouth daily. 1 Package 11    BP 112/65  Pulse 99  Temp 98.1 F (36.7 C) (Oral)  SpO2 99%  LMP 05/29/2012  Physical Exam  Nursing note and vitals reviewed. Constitutional: She appears well-developed and  well-nourished. No distress.  HENT:  Head: Normocephalic and atraumatic.  Neck: Neck supple.  Pulmonary/Chest: Effort normal.  Musculoskeletal:       Right ankle: Normal.       Right foot: She exhibits tenderness and bony tenderness. She exhibits normal capillary refill.       Feet:       Right foot sensation intact, capillary refill < 2 seconds in all digits.  Pt able to move all toes, slightly limited secondary to pain.    Neurological: She is alert.  Skin: She is not diaphoretic.    ED Course  Procedures (including critical care time)  Labs Reviewed - No data to display Dg Foot Complete Right  06/22/2012  *RADIOLOGY REPORT*  Clinical Data: Toe injury.  Hit corner of bed.   Pain in the fourth digit, fourth metatarsal.  Swelling, bruising.  RIGHT FOOT COMPLETE - 3+ VIEW  Comparison: None.  Findings: There is an oblique fracture of the fourth proximal phalanx.  There is associated soft tissue swelling and deformity of the toe.  No other fractures are identified.  IMPRESSION: Fracture of the fourth proximal phalanx.  Original Report Authenticated By: Patterson Hammersmith, M.D.     1. Closed fracture of proximal phalanx of toe       MDM  Pt  accidentally kicked a couch leg and sustained and fracture of her right 4th proximal phalanx of her foot.  Neurovascularly intact.  Pt placed in buddy tape and post-op shoe, crutches, d/c home with percocet for pain, PCP follow up. Discussed all results with patient.  Pt given return precautions.  Pt verbalizes understanding and agrees with plan.           [place], [place] 06/22/12 1314

## 2012-06-22 NOTE — ED Notes (Signed)
Wrapped toes on right foot around leg of sofa immediately PTA, painful and difficulty bearing weight.

## 2012-06-22 NOTE — ED Provider Notes (Signed)
Medical screening examination/treatment/procedure(s) were performed by non-physician practitioner and as supervising physician I was immediately available for consultation/collaboration. Davit Vassar, MD, FACEP   Darneshia Demary L Roosevelt Bisher, MD 06/22/12 1330 

## 2014-02-16 ENCOUNTER — Ambulatory Visit: Payer: Self-pay | Admitting: Internal Medicine

## 2014-02-16 VITALS — BP 110/60 | HR 85 | Temp 98.8°F | Resp 16 | Ht 62.0 in | Wt 139.0 lb

## 2014-02-16 DIAGNOSIS — N912 Amenorrhea, unspecified: Secondary | ICD-10-CM

## 2014-02-16 DIAGNOSIS — L0291 Cutaneous abscess, unspecified: Secondary | ICD-10-CM

## 2014-02-16 DIAGNOSIS — L0390 Cellulitis, unspecified: Secondary | ICD-10-CM

## 2014-02-16 LAB — POCT URINE PREGNANCY: PREG TEST UR: NEGATIVE

## 2014-02-16 MED ORDER — SULFAMETHOXAZOLE-TMP DS 800-160 MG PO TABS: 1.0000 | ORAL_TABLET | Freq: Two times a day (BID) | ORAL | Status: DC

## 2014-02-16 NOTE — Progress Notes (Signed)
Subjective:     Patient ID: Linda Hopkins, female   DOB: 09-02-1981, 33 y.o.   MRN: [id number]  HPI 33 YO caucasian female [facility] abscess that she noticed 3 days ago in her right axilla. The abscess is painful and is draining "white" fluid. She hasn't taken any medicine for this abscess or used any creams and it seems to be getting worse. In addition she also has not had her period in over 5 weeks and wants to make sure she is not pregnant. She is currently sexually active uses no contraception but had her tubes tied in 2011.   Review of Systems  Constitutional: Negative for fever, chills, diaphoresis, activity change and appetite change.  HENT: Negative for congestion, ear pain, postnasal drip and rhinorrhea.   Eyes: Negative for pain and itching.  Respiratory: Negative for cough, chest tightness and wheezing.   Cardiovascular: Negative for chest pain, palpitations and leg swelling.  Gastrointestinal: Negative for nausea, abdominal pain and diarrhea.  Genitourinary: Negative for dysuria.  Musculoskeletal: Negative for arthralgias, joint swelling and myalgias.       Abscess under right axilla       Objective:   Physical Exam  Constitutional: She is oriented to person, place, and time. She appears well-developed and well-nourished. No distress.  Cardiovascular: Normal rate, regular rhythm, normal heart sounds and intact distal pulses.  Exam reveals no gallop and no friction rub.   No murmur heard. Pulmonary/Chest: Effort normal and breath sounds normal. No respiratory distress. She has no wheezes. She has no rales. She exhibits no tenderness.  Abdominal: Soft. Bowel sounds are normal. She exhibits no distension. There is no tenderness. There is no rebound and no guarding.  Musculoskeletal:  Sl tender cyst with expressible purulent drainage under right axilla//no redness  Neurological: She is alert and oriented to person, place, and time.  Skin: She is not diaphoretic.        Assessment:    1) cyst axilla with slight infection/not cellulitis 2) ammenorhea     Plan:    1) Pt. Instructed to take bactrim ds 800160 mg 1 tablet BID #14, pt also instructed to use warm compresses to encourage drainage//f/u if red or not resolved    I have completed the patient encounter in its entirety as documented by [email or web address], with editing by me where necessary. Angelise Petrich P. Merla Richesoolittle, M.D.

## 2016-01-26 ENCOUNTER — Ambulatory Visit (INDEPENDENT_AMBULATORY_CARE_PROVIDER_SITE_OTHER): Payer: Managed Care, Other (non HMO) | Admitting: Family Medicine

## 2016-01-26 VITALS — BP 108/76 | HR 83 | Temp 98.0°F | Resp 16 | Ht 62.0 in | Wt 137.6 lb

## 2016-01-26 DIAGNOSIS — M778 Other enthesopathies, not elsewhere classified: Secondary | ICD-10-CM | POA: Diagnosis not present

## 2016-01-26 NOTE — Progress Notes (Signed)
This is a 35 year old woman who has several months of bilateral wrist pain radiating to her arms. 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 The symptoms began active before she started this job but gotten worse. The pain wakes her up at night.  Patient has no other joint complaints.  Objective: No acute distress BP 108/76 mmHg  Pulse 83  Temp(Src) 98 F (36.7 C) (Oral)  Resp 16  Ht 5\' 2"  (1.575 m)  Wt 137 lb 9.6 oz (62.415 kg)  BMI 25.16 kg/m2  SpO2 99%  LMP 01/25/2016 Examination of both wrists reveal normal contour all other is mild swelling along the brachioradialis tendon on the right. Patient is right-handed and has full range of motion of her wrists. She has some tingling in the palm of her right hand with negative Tinel's. She has no tenderness in the anatomical snuffbox on the right.  Assessment: Tendinitis from repetitive motion.  Plan: 2 weeks of splinting. I've asked her to take an anti-inflammatory every night with dinner.  I've asked her to return if the symptoms are either worsening or not resolving in 2 weeks.  Signed, Sheila OatsKurt Okie Jansson M.D.

## 2016-01-26 NOTE — Patient Instructions (Addendum)
  Please return if her symptoms are either worsening or not resolved by 2 weeks. Hopefully the splinting will make a difference while at work. He can take them off when you're sleeping.  I think it would help to take an anti-inflammatory with dinner every evening.   IF you received an x-ray today, you will receive an invoice from [facility]. Please contact [facility] at [phone] with questions or concerns regarding your invoice.   IF you received labwork today, you will receive an invoice from [facility]. Please contact [facility] at [phone] with questions or concerns regarding your invoice.   Our billing staff will not be able to assist you with questions regarding bills from these companies.  You will be contacted with the lab results as soon as they are available. The fastest way to get your results is to activate your My Chart account. Instructions are located on the last page of this paperwork. If you have not heard from us regarding the results in 2 weeks, please contact this office.

## 2017-12-12 ENCOUNTER — Other Ambulatory Visit: Payer: Self-pay | Admitting: Obstetrics and Gynecology

## 2017-12-12 DIAGNOSIS — N6320 Unspecified lump in the left breast, unspecified quadrant: Secondary | ICD-10-CM

## 2017-12-14 ENCOUNTER — Ambulatory Visit
Admission: RE | Admit: 2017-12-14 | Discharge: 2017-12-14 | Disposition: A | Discharge status: Home / Self Care | Payer: Managed Care, Other (non HMO) | Source: Ambulatory Visit | Attending: Obstetrics and Gynecology | Admitting: Obstetrics and Gynecology

## 2017-12-14 DIAGNOSIS — N6320 Unspecified lump in the left breast, unspecified quadrant: Secondary | ICD-10-CM

## 2019-04-07 ENCOUNTER — Telehealth: Payer: Self-pay | Admitting: *Deleted

## 2019-04-07 ENCOUNTER — Encounter: Payer: Self-pay | Admitting: Family Medicine

## 2019-04-07 ENCOUNTER — Other Ambulatory Visit: Payer: Self-pay

## 2019-04-07 ENCOUNTER — Encounter: Payer: Self-pay | Admitting: *Deleted

## 2019-04-07 ENCOUNTER — Ambulatory Visit (INDEPENDENT_AMBULATORY_CARE_PROVIDER_SITE_OTHER): Payer: Self-pay

## 2019-04-07 ENCOUNTER — Ambulatory Visit (INDEPENDENT_AMBULATORY_CARE_PROVIDER_SITE_OTHER): Payer: Self-pay | Admitting: Family Medicine

## 2019-04-07 VITALS — BP 110/60 | HR 113 | Temp 98.9°F | Resp 12 | Ht 62.0 in | Wt 141.0 lb

## 2019-04-07 DIAGNOSIS — M25551 Pain in right hip: Secondary | ICD-10-CM

## 2019-04-07 DIAGNOSIS — R000 Tachycardia, unspecified: Secondary | ICD-10-CM

## 2019-04-07 DIAGNOSIS — M545 Low back pain, unspecified: Secondary | ICD-10-CM

## 2019-04-07 LAB — POCT URINE PREGNANCY: Preg Test, Ur: NEGATIVE

## 2019-04-07 MED ORDER — KETOROLAC TROMETHAMINE 60 MG/2ML IM SOLN
60.0000 mg | Freq: Once | INTRAMUSCULAR | Status: AC
  Administered 2019-04-07: 60 mg via INTRAMUSCULAR
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
## 2019-04-08 ENCOUNTER — Telehealth: Payer: Self-pay

## 2019-04-08 NOTE — Telephone Encounter (Signed)
Discussed results with patient, patient expressed understanding. Nothing further needed. ° °

## 2019-04-08 NOTE — Telephone Encounter (Signed)
Patient called to receive her xray results and patient also stated that the Toradol injection did not help her right hip pain at all and patient wants to know next steps of what she needs to do to give her some relief.

## 2019-04-22 ENCOUNTER — Telehealth: Payer: Self-pay | Admitting: Family Medicine

## 2019-04-22 NOTE — Telephone Encounter (Signed)
Pt called and stated that she was seen in the office for hip pain and states that the shot she revived did not help at all and would like to know if some pain medication can be called in. Please advise    Pt would also like to know if a copy of her xray can be sent to her. Please advise

## 2019-04-23 NOTE — Telephone Encounter (Signed)
She can take OTC Ibuprofen 600 mg tid as needed with food. Sport medicine evaluation is an options and referral can be placed.  Thanks, BJ

## 2019-04-24 NOTE — Telephone Encounter (Signed)
Spoke with the patient. She is aware of Dr. Morrison Old message below. She would not like a referral at this time.

## 2019-08-29 ENCOUNTER — Ambulatory Visit (INDEPENDENT_AMBULATORY_CARE_PROVIDER_SITE_OTHER): Payer: BC Managed Care – PPO | Admitting: Family Medicine

## 2019-08-29 ENCOUNTER — Other Ambulatory Visit: Payer: Self-pay

## 2019-08-29 ENCOUNTER — Encounter: Payer: Self-pay | Admitting: Family Medicine

## 2019-08-29 VITALS — BP 98/64 | HR 70 | Temp 98.5°F | Resp 16 | Ht 62.0 in | Wt 136.0 lb

## 2019-08-29 DIAGNOSIS — Z1329 Encounter for screening for other suspected endocrine disorder: Secondary | ICD-10-CM | POA: Diagnosis not present

## 2019-08-29 DIAGNOSIS — R200 Anesthesia of skin: Secondary | ICD-10-CM

## 2019-08-29 DIAGNOSIS — E785 Hyperlipidemia, unspecified: Secondary | ICD-10-CM

## 2019-08-29 DIAGNOSIS — Z13228 Encounter for screening for other metabolic disorders: Secondary | ICD-10-CM

## 2019-08-29 DIAGNOSIS — Z0000 Encounter for general adult medical examination without abnormal findings: Secondary | ICD-10-CM

## 2019-08-29 DIAGNOSIS — Z23 Encounter for immunization: Secondary | ICD-10-CM

## 2019-08-29 DIAGNOSIS — Z130 Encounter for screening for diseases of the blood and blood-forming organs and certain disorders involving the immune mechanism: Secondary | ICD-10-CM

## 2019-08-29 LAB — CBC
HCT: 43.6 % (ref 36.0–46.0)
Hemoglobin: 14.6 g/dL (ref 12.0–15.0)
MCHC: 33.5 g/dL (ref 30.0–36.0)
MCV: 91.7 fl (ref 78.0–100.0)
Platelets: 329 10*3/uL (ref 150.0–400.0)
RBC: 4.75 Mil/uL (ref 3.87–5.11)
RDW: 13.6 % (ref 11.5–15.5)
WBC: 8 10*3/uL (ref 4.0–10.5)

## 2019-08-29 LAB — COMPREHENSIVE METABOLIC PANEL
ALT: 8 U/L (ref 0–35)
AST: 12 U/L (ref 0–37)
Albumin: 4.7 g/dL (ref 3.5–5.2)
Alkaline Phosphatase: 62 U/L (ref 39–117)
BUN: 15 mg/dL (ref 6–23)
CO2: 27 mEq/L (ref 19–32)
Calcium: 9.9 mg/dL (ref 8.4–10.5)
Chloride: 103 mEq/L (ref 96–112)
Creatinine, Ser: 1.03 mg/dL (ref 0.40–1.20)
GFR: 60.4 mL/min (ref >60.00)
Glucose, Bld: 84 mg/dL (ref 70–99)
Potassium: 4.2 mEq/L (ref 3.5–5.1)
Sodium: 137 mEq/L (ref 135–145)
Total Bilirubin: 0.4 mg/dL (ref 0.2–1.2)
Total Protein: 7 g/dL (ref 6.0–8.3)

## 2019-08-29 LAB — LIPID PANEL
Cholesterol: 206 mg/dL — ABNORMAL HIGH (ref 0–200)
HDL: 35.5 mg/dL — ABNORMAL LOW (ref >39.00)
LDL Cholesterol: 153 mg/dL — ABNORMAL HIGH (ref 0–99)
NonHDL: 170.76
Total CHOL/HDL Ratio: 6
Triglycerides: 91 mg/dL (ref 0.0–149.0)
VLDL: 18.2 mg/dL (ref 0.0–40.0)

## 2019-08-29 LAB — VITAMIN B12: Vitamin B-12: 157 pg/mL — ABNORMAL LOW (ref 211–911)

## 2019-08-29 LAB — TSH: TSH: 0.51 u[IU]/mL (ref 0.35–4.50)

## 2019-08-29 LAB — HEMOGLOBIN A1C: Hgb A1c MFr Bld: 5.2 % (ref 4.6–6.5)

## 2019-08-29 MED ORDER — GABAPENTIN 100 MG PO CAPS: 300.0000 mg | ORAL_CAPSULE | Freq: Every day | ORAL | 0 refills | Status: DC

## 2019-08-29 NOTE — Progress Notes (Signed)
HPI:   [name] is a 38 y.o. female, who is here today for her routine physical.  Last CPE: 08/2018.  Regular exercise 3 or more time per week: Not consistently but she considers herself "very active" through her job. Following a healthful diet: Not consistently, she eats what she wants. She lives with her husband and 2 children.  Chronic medical problems: Tobacco use,HLD.  Pap smear: 08/2018, she has an ointment with her gynecologist later this month.  There is no immunization history on file for this patient.  Mammogram: 12/2017 due to lesion found on examination, Bi-Rads 1.  Concerns today:  HLD: LDL has been in the 180's. She has not been consistent with following low fat diet.  RUE numbness like pain for about 4 years. Numbness extends from shoulder to hand.  Problem has been intermittently, started back to 3 weeks ago and it seems to be more frequent. Pain is worse at night, it wakes her up, 9/10. Last night it was 10/10.  She has not identified exacerbating. Movement seems to help. She denies cervical pain, edema,or erythema. Occasionally she is dropping objects she is holding with right hand. Right-handed.   Review of Systems  Constitutional: Negative for appetite change, fatigue and fever.  HENT: Negative for dental problem, hearing loss, mouth sores, sore throat, trouble swallowing and voice change.   Eyes: Negative for redness and visual disturbance.  Respiratory: Negative for cough, shortness of breath and wheezing.   Cardiovascular: Negative for chest pain and leg swelling.  Gastrointestinal: Negative for abdominal pain, nausea and vomiting.       No changes in bowel habits.  Endocrine: Negative for cold intolerance, heat intolerance, polydipsia, polyphagia and polyuria.  Genitourinary: Negative for decreased urine volume, dysuria, hematuria, vaginal bleeding and vaginal discharge.  Musculoskeletal: Negative for arthralgias, gait  problem and myalgias.  Skin: Negative for color change and rash.  Allergic/Immunologic: Negative for environmental allergies.  Neurological: Negative for syncope, weakness and headaches.  Hematological: Negative for adenopathy. Does not bruise/bleed easily.  Psychiatric/Behavioral: Negative for confusion and sleep disturbance. The patient is not nervous/anxious.   All other systems reviewed and are negative.   Current Outpatient Medications on File Prior to Visit  Medication Sig Dispense Refill  . ibuprofen (ADVIL) 600 MG tablet Take by mouth.     No current facility-administered medications on file prior to visit.    History reviewed. No pertinent past medical history.  Past Surgical History:  Procedure Laterality Date  . CESAREAN SECTION     x 2  . DILATION AND CURETTAGE OF UTERUS    . TUBAL LIGATION      Allergies  Allergen Reactions  . Hydrocodone Nausea And Vomiting    Family History  Problem Relation Age of Onset  . Breast cancer Maternal Grandfather     Social History   Socioeconomic History  . Marital status: Married    Spouse name: Not on file  . Number of children: 2  . Years of education: Not on file  . Highest education level: Not on file  Occupational History  . Not on file  Social Needs  . Financial resource strain: Not on file  . Food insecurity    Worry: Not on file    Inability: Not on file  . Transportation needs    Medical: Not on file    Non-medical: Not on file  Tobacco Use  . Smoking status: Current Some Day Smoker  . Smokeless  tobacco: Never Used  Substance and Sexual Activity  . Alcohol use: No  . Drug use: No  . Sexual activity: Yes  Lifestyle  . Physical activity    Days per week: Not on file    Minutes per session: Not on file  . Stress: Not on file  Relationships  . Social Musician on phone: Not on file    Gets together: Not on file    Attends religious service: Not on file    Active member of club or  organization: Not on file    Attends meetings of clubs or organizations: Not on file    Relationship status: Not on file  Other Topics Concern  . Not on file  Social History Narrative  . Not on file     Vitals:   08/29/19 1109  BP: 98/64  Pulse: 70  Resp: 16  Temp: 98.5 F (36.9 C)  SpO2: 98%   Body mass index is 24.87 kg/m.   Wt Readings from Last 3 Encounters:  08/29/19 136 lb (61.7 kg)  04/07/19 141 lb (64 kg)  01/26/16 137 lb 9.6 oz (62.4 kg)     Physical Exam  Nursing note and vitals reviewed. Constitutional: She is oriented to person, place, and time. She appears well-developed and well-nourished. No distress.  HENT:  Head: Normocephalic and atraumatic.  Right Ear: Hearing, tympanic membrane, external ear and ear canal normal.  Left Ear: Hearing, tympanic membrane, external ear and ear canal normal.  Mouth/Throat: Uvula is midline, oropharynx is clear and moist and mucous membranes are normal.  Eyes: Pupils are equal, round, and reactive to light. Conjunctivae and EOM are normal.  Neck: No tracheal deviation present. No thyromegaly present.  Cardiovascular: Normal rate and regular rhythm.  No murmur heard. Pulses:      Dorsalis pedis pulses are 2+ on the right side, and 2+ on the left side.  Respiratory: Effort normal and breath sounds normal. No respiratory distress.  GI: Soft. She exhibits no mass. There is no hepatomegaly. There is no tenderness.  Genitourinary:Comments: Deferred to gyn.  Musculoskeletal: She exhibits no edema. Cervical ROM normal. Some movements cause "discomfort." Right Phalen test positive.Bilateral Tinel negative. No major deformity or signs of synovitis appreciated.  Lymphadenopathy:    She has no cervical adenopathy.       Right: No supraclavicular adenopathy present.       Left: No supraclavicular adenopathy present.  Neurological: She is alert and oriented to person, place, and time. She has normal strength. No cranial nerve  deficit. Coordination and gait normal.  Reflex Scores:      Bicep reflexes are 2+ on the right side and 2+ on the left side.      Patellar reflexes are 2+ on the right side and 2+ on the left side. Skin: Skin is warm. No rash noted. No erythema.  Psychiatric: She has a normal mood and affect. Cognitive function grossly intact. Fairly groomed, good eye contact.   ASSESSMENT AND PLAN:  Ms. KATRYN PLUMMER was here today annual physical examination.   Orders Placed This Encounter  Procedures  . MR Cervical Spine Wo Contrast  . Tdap vaccine greater than or equal to 7yo IM  . CBC  . Lipid panel  . TSH  . Hemoglobin A1c  . Comprehensive metabolic panel  . Vitamin B12    Lab Results  Component Value Date   WBC 8.0 08/29/2019   HGB 14.6 08/29/2019   HCT 43.6  08/29/2019   MCV 91.7 08/29/2019   PLT 329.0 08/29/2019    Lab Results  Component Value Date   VITAMINB12 157 (L) 08/29/2019   Lab Results  Component Value Date   CREATININE 1.03 08/29/2019   BUN 15 08/29/2019   NA 137 08/29/2019   K 4.2 08/29/2019   CL 103 08/29/2019   CO2 27 08/29/2019   Lab Results  Component Value Date   ALT 8 08/29/2019   AST 12 08/29/2019   ALKPHOS 62 08/29/2019   BILITOT 0.4 08/29/2019   Lab Results  Component Value Date   CHOL 206 (H) 08/29/2019   HDL 35.50 (L) 08/29/2019   LDLCALC 153 (H) 08/29/2019   TRIG 91.0 08/29/2019   CHOLHDL 6 08/29/2019   Lab Results  Component Value Date   HGBA1C 5.2 08/29/2019   Lab Results  Component Value Date   TSH 0.51 08/29/2019     Routine general medical examination at a health care facility We discussed the importance of regular physical activity and healthy diet for prevention of chronic illness and/or complications. Preventive guidelines reviewed. Vaccination updated, refused influenza vaccine.  She will continue following with gynecologist for her female preventive care.  Next CPE in a year.  Hyperlipidemia, unspecified  hyperlipidemia type Low fat diet recommended for now. Further recommendations will be given according to results. She is not fasting.  -     Lipid panel  Right upper extremity numbness Possible etiologies discussed. Because problem has been going on for 4 years, cervical MRI will be arranged. Instructed about warning signs. Further recommendations will be given according to labs/imaging results.  Interfering with sleep. She agrees with trying Gabapentin, recommend titrating dose from 100 mg to 300 mg increasing dose every 7 days up to 300 mg at bedtime.  -     gabapentin (NEURONTIN) 100 MG capsule; Take 3 capsules (300 mg total) by mouth at bedtime.  Screening for endocrine, metabolic and immunity disorder -     Hemoglobin A1c -     Comprehensive metabolic panel  Need for diphtheria-tetanus-pertussis (Tdap) vaccine -     Tdap vaccine greater than or equal to 7yo IM    Return in 3 months (on 11/29/2019) for RUE numbness.    Zhane Bluitt G. [place], MD  [facility]. [place] office.

## 2019-08-29 NOTE — Patient Instructions (Addendum)
Today you have you routine preventive visit.  At least 150 minutes of moderate exercise per week, daily brisk walking for 15-30 min is a good exercise option. Healthy diet low in saturated (animal) fats and sweets and consisting of fresh fruits and vegetables, lean meats such as fish and white chicken and whole grains.  These are some of recommendations for screening depending of age and risk factors:  A few things to remember from today's visit:   Routine general medical examination at a health care facility  Hyperlipidemia, unspecified hyperlipidemia type - Plan: Lipid panel  Right upper extremity numbness - Plan: CBC, TSH, Vitamin B12, MR Cervical Spine Wo Contrast, gabapentin (NEURONTIN) 100 MG capsule  Screening for endocrine, metabolic and immunity disorder - Plan: Hemoglobin A1c, Comprehensive metabolic panel  Gabapentin 100 mg started today, increase dose to 2 capsules in 7 days and then 3 capsules in another 7 days.    - Vaccines:  Tdap vaccine every 10 years.  Given today.  Shingles vaccine recommended at age 73, could be given after 36 years of age but not sure about insurance coverage.   Pneumonia vaccines:  Prevnar 13 at 65 and Pneumovax at 59. Sometimes Pneumovax is giving earlier if history of smoking, lung disease,diabetes,kidney disease among some.    Screening for diabetes at age 55 and every 3 years.  Cervical cancer prevention:  Pap smear starts at 36 years of age and continues periodically until 36 years old in low risk women. Pap smear every 3 years between 72 and 71 years old. Pap smear every 3-5 years between women 15 and older if pap smear negative and HPV screening negative.   -Breast cancer: Mammogram: There is disagreement between experts about when to start screening in low risk asymptomatic female but recent recommendations are to start screening at 100 and not later than 36 years old , every 1-2 years and after 36 yo q 2 years. Screening is  recommended until 36 years old but some women can continue screening depending of healthy issues.  Colon cancer screening: starts at 36 years old until 36 years old.   Also recommended:  1. Dental visit- Brush and floss your teeth twice daily; visit your dentist twice a year. 2. Eye doctor- Get an eye exam at least every 2 years. 3. Helmet use- Always wear a helmet when riding a bicycle, motorcycle, rollerblading or skateboarding. 4. Safe sex- If you may be exposed to sexually transmitted infections, use a condom. 5. Seat belts- Seat belts can save your live; always wear one. 6. Smoke/Carbon Monoxide detectors- These detectors need to be installed on the appropriate level of your home. Replace batteries at least once a year. 7. Skin cancer- When out in the sun please cover up and use sunscreen 15 SPF or higher. 8. Violence- If anyone is threatening or hurting you, please tell your healthcare provider.  9. Drink alcohol in moderation- Limit alcohol intake to one drink or less per day. Never drink and drive.

## 2019-09-01 ENCOUNTER — Encounter: Payer: Self-pay | Admitting: Family Medicine

## 2019-09-03 NOTE — Progress Notes (Signed)
Patient is scheduled for 01/22 at 10 AM

## 2019-09-22 DIAGNOSIS — M5411 Radiculopathy, occipito-atlanto-axial region: Secondary | ICD-10-CM | POA: Diagnosis not present

## 2019-09-22 DIAGNOSIS — M531 Cervicobrachial syndrome: Secondary | ICD-10-CM | POA: Diagnosis not present

## 2019-09-22 DIAGNOSIS — G44301 Post-traumatic headache, unspecified, intractable: Secondary | ICD-10-CM | POA: Diagnosis not present

## 2019-09-22 DIAGNOSIS — M5412 Radiculopathy, cervical region: Secondary | ICD-10-CM | POA: Diagnosis not present

## 2019-09-23 DIAGNOSIS — G44301 Post-traumatic headache, unspecified, intractable: Secondary | ICD-10-CM | POA: Diagnosis not present

## 2019-09-23 DIAGNOSIS — M5411 Radiculopathy, occipito-atlanto-axial region: Secondary | ICD-10-CM | POA: Diagnosis not present

## 2019-09-23 DIAGNOSIS — M531 Cervicobrachial syndrome: Secondary | ICD-10-CM | POA: Diagnosis not present

## 2019-09-23 DIAGNOSIS — M5412 Radiculopathy, cervical region: Secondary | ICD-10-CM | POA: Diagnosis not present

## 2019-09-24 DIAGNOSIS — M5411 Radiculopathy, occipito-atlanto-axial region: Secondary | ICD-10-CM | POA: Diagnosis not present

## 2019-09-24 DIAGNOSIS — M531 Cervicobrachial syndrome: Secondary | ICD-10-CM | POA: Diagnosis not present

## 2019-09-24 DIAGNOSIS — M5412 Radiculopathy, cervical region: Secondary | ICD-10-CM | POA: Diagnosis not present

## 2019-09-24 DIAGNOSIS — G44301 Post-traumatic headache, unspecified, intractable: Secondary | ICD-10-CM | POA: Diagnosis not present

## 2019-09-25 DIAGNOSIS — M5411 Radiculopathy, occipito-atlanto-axial region: Secondary | ICD-10-CM | POA: Diagnosis not present

## 2019-09-25 DIAGNOSIS — G44301 Post-traumatic headache, unspecified, intractable: Secondary | ICD-10-CM | POA: Diagnosis not present

## 2019-09-25 DIAGNOSIS — M5412 Radiculopathy, cervical region: Secondary | ICD-10-CM | POA: Diagnosis not present

## 2019-09-25 DIAGNOSIS — M531 Cervicobrachial syndrome: Secondary | ICD-10-CM | POA: Diagnosis not present

## 2019-09-26 ENCOUNTER — Other Ambulatory Visit: Payer: Self-pay

## 2019-09-26 ENCOUNTER — Ambulatory Visit
Admission: RE | Admit: 2019-09-26 | Discharge: 2019-09-26 | Disposition: A | Discharge status: Home / Self Care | Payer: BC Managed Care – PPO | Source: Ambulatory Visit | Attending: Family Medicine | Admitting: Family Medicine

## 2019-09-26 DIAGNOSIS — R200 Anesthesia of skin: Secondary | ICD-10-CM

## 2019-09-26 DIAGNOSIS — M4802 Spinal stenosis, cervical region: Secondary | ICD-10-CM | POA: Diagnosis not present

## 2019-09-29 ENCOUNTER — Telehealth: Payer: Self-pay

## 2019-09-29 DIAGNOSIS — G44301 Post-traumatic headache, unspecified, intractable: Secondary | ICD-10-CM | POA: Diagnosis not present

## 2019-09-29 DIAGNOSIS — M5412 Radiculopathy, cervical region: Secondary | ICD-10-CM | POA: Diagnosis not present

## 2019-09-29 DIAGNOSIS — M531 Cervicobrachial syndrome: Secondary | ICD-10-CM | POA: Diagnosis not present

## 2019-09-29 DIAGNOSIS — M5411 Radiculopathy, occipito-atlanto-axial region: Secondary | ICD-10-CM | POA: Diagnosis not present

## 2019-09-29 NOTE — Telephone Encounter (Signed)
Pt states the nurse this morning told her that her MRI results were back, but couldn't give them to her.  Pt states she has been waiting all day for a call back about this.  Pt would like a call by end of day with results.

## 2019-09-29 NOTE — Telephone Encounter (Signed)
Please advise. I do not see any result notes on this. 

## 2019-09-29 NOTE — Telephone Encounter (Signed)
Copied from [place] [phone]. Topic: General - Other >> Sep 29, 2019 12:05 PM Carolyn Stare wrote: Pt call to fup on her MRI results, would like a call back

## 2019-09-29 NOTE — Telephone Encounter (Signed)
Message has been sent to Dr. Martinique. Awaiting a reply.

## 2019-09-30 ENCOUNTER — Encounter: Payer: Self-pay | Admitting: Family Medicine

## 2019-09-30 ENCOUNTER — Telehealth (INDEPENDENT_AMBULATORY_CARE_PROVIDER_SITE_OTHER): Payer: BC Managed Care – PPO | Admitting: Family Medicine

## 2019-09-30 ENCOUNTER — Other Ambulatory Visit: Payer: Self-pay

## 2019-09-30 DIAGNOSIS — E538 Deficiency of other specified B group vitamins: Secondary | ICD-10-CM | POA: Diagnosis not present

## 2019-09-30 DIAGNOSIS — M5412 Radiculopathy, cervical region: Secondary | ICD-10-CM

## 2019-09-30 DIAGNOSIS — G8929 Other chronic pain: Secondary | ICD-10-CM

## 2019-09-30 MED ORDER — DULOXETINE HCL 30 MG PO CPEP: 30.0000 mg | ORAL_CAPSULE | Freq: Every day | ORAL | 1 refills | Status: DC

## 2019-09-30 NOTE — Telephone Encounter (Signed)
Patient would like clarity regarding results and states she would like a call back by 9am, patient wanted to make Practice Admin aware this is her 3x calling, please advise

## 2019-09-30 NOTE — Telephone Encounter (Signed)
Patient called [facility] and is very "irate" that someone has not called her back regarding her results. She has asked for a call back multiple times and has yet to receive one. She does not want to receive results on My Chart she wants to speak with someone directly. There have been several messages conveying this request.   Please contact patient ASAP to review results.

## 2019-09-30 NOTE — Telephone Encounter (Signed)
Spoke with patient. She has been scheduled for 3:00 this afternoon. The patient stated that she wanted to speak with Dr. Martinique about her lab results and she wanted to speak with her now. I explained to the patient that Dr. Martinique was in clinic with other patients and that an appointment was made for her for 3:00 and Dr. Martinique would go over her lab results then. The patient stated that this was unacceptable, she should not have to wait to speak with Dr. Martinique and if she does not receive a call at 3:00 she is calling her lawyer.

## 2019-09-30 NOTE — Telephone Encounter (Signed)
Please arrange virtual visit to discuss results. Thanks, BJ

## 2019-09-30 NOTE — Progress Notes (Signed)
Virtual Visit via Video Note   I connected with Ms Dace on 09/30/19 by a video enabled telemedicine application and verified that I am speaking with the correct person using two identifiers.  Location patient: home Location provider:work office Persons participating in the virtual visit: patient, provider  I discussed the limitations of evaluation and management by telemedicine and the availability of in person appointments. The patient expressed understanding and agreed to proceed.   HPI: Ms Friis is a 38 yo female who I am seeing today to go through lab and imaging results and decide plan of treatment. She was last seen on 08/29/2019, when she was complaining about right upper extremity numbness/pain.  Problem has been going on for about 4 years and getting worse. Pain is interfering with sleep, 9-10/10. I recommended gabapentin, which she was instructed to titrate up from 100 mg to 300 mg, medication has not helped at all. No new associated symptoms. Blood work was done:  Materials engineer Value Date   VITAMINB12 157 (L) 08/29/2019   Cervical MRI done on 09/26/2019 showed:Diffuse disc protrusion at C5-6, more prominent on the left than the right. Disc protrusion extends into the left foramen causing moderate to severe left foraminal encroachment. Mild associated spurring. Disc protrusion on the right also cause causes moderate to severe right foraminal encroachment. Mild spinal stenosis at C5-6 Small central disc protrusion C3-4 without stenosis.  ROS: See pertinent positives and negatives per HPI.  No past medical history on file.  Past Surgical History:  Procedure Laterality Date  . CESAREAN SECTION     x 2  . DILATION AND CURETTAGE OF UTERUS    . TUBAL LIGATION      Family History  Problem Relation Age of Onset  . Breast cancer Maternal Grandfather     Social History   Socioeconomic History  . Marital status: Married    Spouse name: Not on file  . Number  of children: 2  . Years of education: Not on file  . Highest education level: Not on file  Occupational History  . Not on file  Social Needs  . Financial resource strain: Not on file  . Food insecurity    Worry: Not on file    Inability: Not on file  . Transportation needs    Medical: Not on file    Non-medical: Not on file  Tobacco Use  . Smoking status: Current Some Day Smoker  . Smokeless tobacco: Never Used  Substance and Sexual Activity  . Alcohol use: No  . Drug use: No  . Sexual activity: Yes  Lifestyle  . Physical activity    Days per week: Not on file    Minutes per session: Not on file  . Stress: Not on file  Relationships  . Social Herbalist on phone: Not on file    Gets together: Not on file    Attends religious service: Not on file    Active member of club or organization: Not on file    Attends meetings of clubs or organizations: Not on file    Relationship status: Not on file  . Intimate partner violence    Fear of current or ex partner: Not on file    Emotionally abused: Not on file    Physically abused: Not on file    Forced sexual activity: Not on file  Other Topics Concern  . Not on file  Social History Narrative  . Not on file  Current Outpatient Medications:  .  gabapentin (NEURONTIN) 100 MG capsule, Take 3 capsules (300 mg total) by mouth at bedtime., Disp: 90 capsule, Rfl: 0 .  ibuprofen (ADVIL) 600 MG tablet, Take by mouth., Disp: , Rfl:   EXAM:  VITALS per patient if applicable:  GENERAL: alert, oriented, appears well and in no acute distress  HEENT: atraumatic, conjunctiva clear, no obvious abnormalities on inspection of external nose and ears  NECK: normal movements of the head and neck  LUNGS: on inspection no signs of respiratory distress, breathing rate appears normal, no obvious gross SOB, gasping or wheezing  CV: no obvious cyanosis  MS: moves all visible extremities without noticeable  abnormality  PSYCH/NEURO: pleasant and cooperative, no obvious depression or anxiety, speech and thought processing grossly intact  ASSESSMENT AND PLAN:  Discussed the following assessment and plan:  Chronic radicular cervical pain - Plan: Ambulatory referral to Orthopedic Surgery, DULoxetine (CYMBALTA) 30 MG capsule Discussed cervical MRI in detail, questions answered. Gabapentin has not helped, so recommend discontinuing it. She agrees with trying Cymbalta 30 mg daily, dose can be increased to 60 mg in 4 to 6 weeks. Ortho referral placed. Instructed about warning signs.  B12 deficiency Recommend B12 100 mcg IM weekly x4, then she can continue oral supplementation. Explained that I am not sure about insurance coverage for injections here in the office.  She would like to arrange nurse visit for B12 injection and will call her insurance to find out if this is covered, otherwise she will continue giving B12 herself.   We also discussed rest lab results.  I discussed the assessment and treatment plan with the patient. She was provided an opportunity to ask questions and all were answered. She agreed with the plan and demonstrated an understanding of the instructions.   The patient was advised to call back or seek an in-person evaluation if the symptoms worsen or if the condition fails to improve as anticipated.  Return for Needs nurse visit for B12 injection..     [place], MD

## 2019-09-30 NOTE — Telephone Encounter (Signed)
Spoke with patient who was very rude an irrate and demands a call from Dr. Martinique by lunch time or "she will call her lawyer." Patient reports she is confused as to why PCP is saying her left side as she is having pain in her right side.

## 2019-09-30 NOTE — Telephone Encounter (Signed)
Results were sent to mychart. Patient is aware.

## 2019-10-08 DIAGNOSIS — M5412 Radiculopathy, cervical region: Secondary | ICD-10-CM | POA: Diagnosis not present

## 2019-10-08 DIAGNOSIS — G44301 Post-traumatic headache, unspecified, intractable: Secondary | ICD-10-CM | POA: Diagnosis not present

## 2019-10-08 DIAGNOSIS — M5411 Radiculopathy, occipito-atlanto-axial region: Secondary | ICD-10-CM | POA: Diagnosis not present

## 2019-10-08 DIAGNOSIS — M531 Cervicobrachial syndrome: Secondary | ICD-10-CM | POA: Diagnosis not present

## 2019-10-22 ENCOUNTER — Encounter: Payer: Self-pay | Admitting: Family Medicine

## 2019-10-24 ENCOUNTER — Ambulatory Visit: Payer: BC Managed Care – PPO | Admitting: Orthopaedic Surgery

## 2019-10-29 ENCOUNTER — Other Ambulatory Visit: Payer: Self-pay

## 2019-10-29 ENCOUNTER — Encounter: Payer: Self-pay | Admitting: Family Medicine

## 2019-10-29 DIAGNOSIS — G8929 Other chronic pain: Secondary | ICD-10-CM

## 2019-10-29 DIAGNOSIS — M5412 Radiculopathy, cervical region: Secondary | ICD-10-CM

## 2019-10-29 MED ORDER — DULOXETINE HCL 30 MG PO CPEP: 30.0000 mg | ORAL_CAPSULE | Freq: Every day | ORAL | 0 refills | Status: DC

## 2019-10-30 ENCOUNTER — Encounter: Payer: Self-pay | Admitting: Family Medicine

## 2019-11-14 ENCOUNTER — Ambulatory Visit: Payer: BC Managed Care – PPO | Admitting: Orthopaedic Surgery

## 2019-11-21 ENCOUNTER — Encounter: Payer: Self-pay | Admitting: Orthopaedic Surgery

## 2019-11-21 ENCOUNTER — Ambulatory Visit (INDEPENDENT_AMBULATORY_CARE_PROVIDER_SITE_OTHER): Payer: BC Managed Care – PPO

## 2019-11-21 ENCOUNTER — Ambulatory Visit: Payer: BC Managed Care – PPO | Admitting: Family Medicine

## 2019-11-21 ENCOUNTER — Other Ambulatory Visit: Payer: Self-pay

## 2019-11-21 ENCOUNTER — Ambulatory Visit (INDEPENDENT_AMBULATORY_CARE_PROVIDER_SITE_OTHER): Payer: BC Managed Care – PPO | Admitting: Orthopaedic Surgery

## 2019-11-21 VITALS — BP 99/69 | HR 93 | Ht 63.0 in | Wt 141.6 lb

## 2019-11-21 DIAGNOSIS — M4802 Spinal stenosis, cervical region: Secondary | ICD-10-CM

## 2019-11-21 DIAGNOSIS — M4722 Other spondylosis with radiculopathy, cervical region: Secondary | ICD-10-CM

## 2019-11-21 DIAGNOSIS — M542 Cervicalgia: Secondary | ICD-10-CM | POA: Diagnosis not present
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
## 2019-11-25 DIAGNOSIS — M4802 Spinal stenosis, cervical region: Secondary | ICD-10-CM | POA: Insufficient documentation

## 2019-11-25 DIAGNOSIS — M4722 Other spondylosis with radiculopathy, cervical region: Secondary | ICD-10-CM | POA: Insufficient documentation

## 2019-11-28 ENCOUNTER — Ambulatory Visit: Payer: BC Managed Care – PPO | Admitting: Family Medicine

## 2020-01-19 ENCOUNTER — Telehealth: Payer: Self-pay | Admitting: Orthopaedic Surgery

## 2020-01-19 NOTE — Telephone Encounter (Signed)
FYI

## 2020-01-19 NOTE — Telephone Encounter (Signed)
Patient seen 11-21-19 for neck pain and surgery sheet completed by Dr. Ophelia Charter. At the time patient was seen, elective surgery cases had not resumed.  I called patient today to discuss a surgery date.  She says she still has problems with the neck, but is not ready to have surgery.  She has my name and direct number if and when she decides to move forward with c5-6 fusion.

## 2020-02-03 ENCOUNTER — Other Ambulatory Visit: Payer: Self-pay

## 2020-02-03 ENCOUNTER — Emergency Department (INDEPENDENT_AMBULATORY_CARE_PROVIDER_SITE_OTHER)
Admission: EM | Admit: 2020-02-03 | Discharge: 2020-02-03 | Disposition: A | Discharge status: Home / Self Care | Payer: BC Managed Care – PPO | Source: Home / Self Care

## 2020-02-03 ENCOUNTER — Ambulatory Visit (INDEPENDENT_AMBULATORY_CARE_PROVIDER_SITE_OTHER)
Admission: RE | Admit: 2020-02-03 | Discharge: 2020-02-03 | Disposition: A | Discharge status: Home / Self Care | Payer: BC Managed Care – PPO | Source: Ambulatory Visit

## 2020-02-03 DIAGNOSIS — R6883 Chills (without fever): Secondary | ICD-10-CM | POA: Diagnosis not present

## 2020-02-03 DIAGNOSIS — R110 Nausea: Secondary | ICD-10-CM

## 2020-02-03 DIAGNOSIS — R6889 Other general symptoms and signs: Secondary | ICD-10-CM | POA: Diagnosis not present

## 2020-02-03 DIAGNOSIS — Z20822 Contact with and (suspected) exposure to covid-19: Secondary | ICD-10-CM

## 2020-02-03 DIAGNOSIS — R197 Diarrhea, unspecified: Secondary | ICD-10-CM

## 2020-02-03 DIAGNOSIS — A084 Viral intestinal infection, unspecified: Secondary | ICD-10-CM

## 2020-02-03 LAB — POCT URINALYSIS DIP (MANUAL ENTRY)
Bilirubin, UA: NEGATIVE
Glucose, UA: NEGATIVE mg/dL
Ketones, POC UA: NEGATIVE mg/dL
Leukocytes, UA: NEGATIVE
Nitrite, UA: NEGATIVE
Protein Ur, POC: NEGATIVE mg/dL
Spec Grav, UA: 1.025 (ref 1.010–1.025)
Urobilinogen, UA: 0.2 E.U./dL
pH, UA: 6 (ref 5.0–8.0)

## 2020-02-03 LAB — POCT INFLUENZA A/B
Influenza A, POC: NEGATIVE
Influenza B, POC: NEGATIVE

## 2020-02-03 LAB — POCT URINE PREGNANCY: Preg Test, Ur: NEGATIVE

## 2020-02-03 MED ORDER — ONDANSETRON HCL 4 MG PO TABS: 4.0000 mg | ORAL_TABLET | Freq: Four times a day (QID) | ORAL | 0 refills | Status: DC

## 2020-02-03 NOTE — Discharge Instructions (Signed)
Declines COVID test at this time Will go to [place] location to have rapid flu and urine checked.  Someone will follow up with you regarding abnormal results.    In the meantime: You should remain isolated in your home for 10 days from symptom onset AND greater than 72 hours after symptoms resolution (absence of fever without the use of fever-reducing medication and improvement in respiratory symptoms), whichever is longer Get plenty of rest and push fluids Supplement with OTC Pedialyte at needed Progress diet as tolerated Zofran prescribed.  Use as needed for nausea Use OTC medications like ibuprofen or tylenol as needed fever or pain Follow up in person or go to the ER such as fever, chills, vomiting, persistent diarrhea, bloody or dark tarry stools, constipation, urinary symptoms, worsening abdominal discomfort, symptoms that do not improve with medications, inability to keep fluids down, etc..[name]

## 2020-02-03 NOTE — ED Provider Notes (Signed)
[facility]    Virtual Visit via Video Note:  [name]  initiated request for Telemedicine visit with [facility] team. I connected with Linda Hopkins  on 02/03/2020 at 9:37 AM  for a synchronized telemedicine visit using a video enabled HIPPA compliant telemedicine application. I verified that I am speaking with [name]  using two identifiers. [name], PA-C  was physically located in a [facility] Urgent care site and Linda Hopkins was located at a different location.   The limitations of evaluation and management by telemedicine as well as the availability of in-person appointments were discussed. Patient was informed that she  may incur a bill ( including co-pay) for this virtual visit encounter. Linda Hopkins  expressed understanding and gave verbal consent to proceed with virtual visit.   [id number] 02/03/20 Arrival Time: 6144  CC: ABDOMINAL DISCOMFORT  SUBJECTIVE:  Linda Hopkins is a 39 y.o. female who presents with complaint of chills, nausea, loose/ watery diarrhea x 4 episodes, and lower abdominal discomfort x 1 day.  Denies a precipitating event, trauma, close contacts with similar symptoms, recent travel or antibiotic use.  Denies of exposure to COVID.  Localizes pain to lower abdomen.  Describes as intermittent and sharp in character. Speculates this may also be secondary to having a BM or uterine fibroids. Has tried OTC advil and heating pad with relief.  Worse with standing, and having bouts of diarrhea.  Reports similar symptoms in the past with the flu.  Last BM 20 minutes ago with loose stools.    Denies fever, appetite changes, weight changes, vomiting, chest pain, SOB, constipation, hematochezia, melena, dysuria, difficulty urinating, increased frequency or urgency, flank pain, loss of bowel or bladder function, vaginal discharge, vaginal odor, vaginal pain, vaginal bleeding, dyspareunia, pelvic pain.     LMP: 01/05/20,  currently sexually active; not on [facility] but has had a tubal ligation  Hx of 3 uterine fibroid cyst  ROS: As per HPI.  All other pertinent ROS negative.     History reviewed. No pertinent past medical history. Past Surgical History:  Procedure Laterality Date  . CESAREAN SECTION     x 2  . DILATION AND CURETTAGE OF UTERUS    . TUBAL LIGATION     Allergies  Allergen Reactions  . Hydrocodone Nausea And Vomiting   No current facility-administered medications on file prior to encounter.   Current Outpatient Medications on File Prior to Encounter  Medication Sig Dispense Refill  . DULoxetine (CYMBALTA) 30 MG capsule Take 1 capsule (30 mg total) by mouth daily. 90 capsule 0  . gabapentin (NEURONTIN) 100 MG capsule Take 3 capsules (300 mg total) by mouth at bedtime. 90 capsule 0  . ibuprofen (ADVIL) 600 MG tablet Take by mouth.       OBJECTIVE:  There were no vitals filed for this visit.  General appearance: alert; appears mildly fatigued Eyes: EOMI grossly HENT: normocephalic; atraumatic Neck: supple with FROM Lungs: normal respiratory effort; speaking in full sentences without difficulty Extremities: moves extremities without difficulty Skin: No obvious rashes Neurologic: No facial asymmetries Psychological: alert and cooperative; normal mood and affect   ASSESSMENT & PLAN:  1. Viral gastroenteritis   2. Flu-like symptoms   3. Suspected COVID-19 virus infection     Meds ordered this encounter  Medications  . ondansetron (ZOFRAN) 4 MG tablet    Sig: Take 1 tablet (4 mg total) by mouth every 6 (six) hours.  Dispense:  12 tablet    Refill:  0    Order Specific Question:   Supervising Provider    Answer:   Eustace Moore [[id number]]    Declines COVID test at this time Will go to [place] location to have rapid flu and urine checked.  Someone will follow up with you regarding abnormal results.    In the meantime: You should remain isolated in your home for 10  days from symptom onset AND greater than 72 hours after symptoms resolution (absence of fever without the use of fever-reducing medication and improvement in respiratory symptoms), whichever is longer Get plenty of rest and push fluids Supplement with OTC Pedialyte at needed Progress diet as tolerated Zofran prescribed.  Use as needed for nausea Use OTC medications like ibuprofen or tylenol as needed fever or pain Follow up in person or go to the ER such as fever, chills, vomiting, persistent diarrhea, bloody or dark tarry stools, constipation, urinary symptoms, worsening abdominal discomfort, symptoms that do not improve with medications, inability to keep fluids down, etc...  I discussed the assessment and treatment plan with the patient. The patient was provided an opportunity to ask questions and all were answered. The patient agreed with the plan and demonstrated an understanding of the instructions.   The patient was advised to call back or seek an in-person evaluation if the symptoms worsen or if the condition fails to improve as anticipated.  I provided 15 minutes of non-face-to-face time during this encounter.  Rennis Harding, PA-C  02/03/2020 9:37 AM    Rennis Harding, PA-C 02/03/20 [phone]

## 2020-02-03 NOTE — ED Triage Notes (Signed)
Patient presents to Urgent Care with complaints of intermittent hot flashes, nausea, and lower back pain since earlier today. Patient reports she had a VV earlier today and was told to come in to have a rapid flu test and a UA completed.

## 2020-02-04 ENCOUNTER — Encounter (INDEPENDENT_AMBULATORY_CARE_PROVIDER_SITE_OTHER): Payer: Self-pay

## 2020-02-04 ENCOUNTER — Encounter: Payer: Self-pay | Admitting: Family Medicine

## 2020-02-11 ENCOUNTER — Encounter: Payer: Self-pay | Admitting: Family Medicine

## 2020-02-11 NOTE — Telephone Encounter (Signed)
Noted  

## 2020-02-11 NOTE — Telephone Encounter (Signed)
Spoke with patient about provider and staff complaints. Patient voice concerns of test results when received not being explained and just feel Dr. Sw[place]iland not a good fit for patient. I Apologized to patient and explained to patient her concerns will be addressed and given information on obtaining medical records if she does decide to change providers. Pt understands.

## 2020-02-11 NOTE — Telephone Encounter (Signed)
Spoke with patient about how to transfer care and how to receive medical records. Pt understands.

## 2020-08-03 ENCOUNTER — Other Ambulatory Visit: Payer: Self-pay

## 2020-08-03 ENCOUNTER — Emergency Department (INDEPENDENT_AMBULATORY_CARE_PROVIDER_SITE_OTHER)
Admission: RE | Admit: 2020-08-03 | Discharge: 2020-08-03 | Disposition: A | Discharge status: Home / Self Care | Payer: 59 | Source: Ambulatory Visit | Attending: Family Medicine | Admitting: Family Medicine

## 2020-08-03 VITALS — BP 106/73 | HR 91 | Temp 98.3°F | Resp 18 | Ht 64.0 in | Wt 138.0 lb

## 2020-08-03 DIAGNOSIS — Z20822 Contact with and (suspected) exposure to covid-19: Secondary | ICD-10-CM | POA: Diagnosis not present

## 2020-08-03 DIAGNOSIS — J069 Acute upper respiratory infection, unspecified: Secondary | ICD-10-CM

## 2020-08-03 NOTE — ED Triage Notes (Signed)
Cough, body aches, sore throat, chest tightness x 3 days.Covid Exposure. Not vaccinated

## 2020-08-03 NOTE — Discharge Instructions (Addendum)
Take plain guaifenesin (1200mg  extended release tabs such as Mucinex) twice daily, with plenty of water, for cough and congestion.  May add Pseudoephedrine (30mg , one or two every 4 to 6 hours) for sinus congestion.  Get adequate rest.   May use Afrin nasal spray (or generic oxymetazoline) each morning for about 5 days and then discontinue.  Also recommend using saline nasal spray several times daily and saline nasal irrigation (AYR is a common brand).  Use Flonase nasal spray each morning after using Afrin nasal spray and saline nasal irrigation. Try warm salt water gargles for sore throat.  Stop all antihistamines for now, and other non-prescription cough/cold preparations. May take Ibuprofen 200mg , 4 tabs every 8 hours with food for chest/sternum discomfort, body aches, fever, etc. May take Delsym Cough Suppressant ("12 Hour Cough Relief") at bedtime for nighttime cough.   Isolate yourself until COVID-19 test result is available.   If your COVID19 test is positive, then you are infected with the novel coronavirus and could give the virus to others.  Please continue isolation at home for at least 10 days since the start of your symptoms.  Once you complete your 10 day quarantine, you may return to normal activities as long as you've not had a fever for over 24 hours (without taking fever reducing medicine) and your symptoms are improving. Please continue good preventive care measures, including:  frequent hand-washing, avoid touching your face, cover coughs/sneezes, stay out of crowds and keep a 6 foot distance from others.  Go to the nearest hospital emergency room if fever/cough/breathlessness are severe or illness seems like a threat to life.

## 2020-08-03 NOTE — ED Provider Notes (Signed)
[facility]    CSN: [id number] Arrival date & time: 08/03/20  4098      History   Chief Complaint Chief Complaint  Patient presents with  . Cough    HPI Linda Hopkins is a 39 y.o. female.   Patient complains of 2.5 day history of mild sore throat, headache, fatigue, sinus congestion, and cough.  She feels tight in her anterior chest but denies pleuritic pain.  The history is provided by the patient.    History reviewed. No pertinent past medical history.  Patient Active Problem List   Diagnosis Date Noted  . Other spondylosis with radiculopathy, cervical region 11/25/2019  . Foraminal stenosis of cervical region 11/25/2019  . B12 deficiency 09/30/2019  . Chronic radicular cervical pain 09/30/2019  . Pelvic pain 04/04/2012  . BV (bacterial vaginosis) 04/04/2012    Past Surgical History:  Procedure Laterality Date  . CESAREAN SECTION     x 2  . DILATION AND CURETTAGE OF UTERUS    . TUBAL LIGATION      OB History    Gravida  3   Para  2   Term  2   Preterm      AB  1   Living  2     SAB      TAB  1   Ectopic      Multiple      Live Births               Home Medications    Prior to Admission medications   Medication Sig Start Date End Date Taking? Authorizing Provider  ibuprofen (ADVIL) 600 MG tablet Take by mouth. 04/16/17   [provider]    Family History Family History  Problem Relation Age of Onset  . Breast cancer Maternal Grandfather   . Cancer Mother     Social History Social History   Tobacco Use  . Smoking status: Current Some Day Smoker    Packs/day: 0.20    Types: Cigarettes  . Smokeless tobacco: Never Used  Vaping Use  . Vaping Use: Never used  Substance Use Topics  . Alcohol use: No  . Drug use: No     Allergies   Hydrocodone   Review of Systems Review of Systems + sore throat + cough No pleuritic pain, but feels tight in anterior chest No wheezing + nasal congestion +  post-nasal drainage No sinus pain/pressure No itchy/red eyes No earache No hemoptysis No SOB No fever/chills No nausea No vomiting No abdominal pain + mild diarrhea No urinary symptoms No skin rash + fatigue + myalgias + headache   Physical Exam Triage Vital Signs ED Triage Vitals  Enc Vitals Group     BP 08/03/20 0956 106/73     Pulse Rate 08/03/20 0956 91     Resp 08/03/20 0956 18     Temp 08/03/20 0956 98.3 F (36.8 C)     Temp Source 08/03/20 0956 Oral     SpO2 08/03/20 0956 100 %     Weight 08/03/20 0958 138 lb (62.6 kg)     Height 08/03/20 0958 5\' 4"  (1.626 m)     Head Circumference --      Peak Flow --      Pain Score 08/03/20 0957 5     Pain Loc --      Pain Edu? --      Excl. in GC? --    No data found.  Updated Vital  Signs BP 106/73 (BP Location: Right Arm)   Pulse 91   Temp 98.3 F (36.8 C) (Oral)   Resp 18   Ht 5\' 4"  (1.626 m)   Wt 62.6 kg   LMP 07/21/2020 (Exact Date)   SpO2 100%   BMI 23.69 kg/m   Visual Acuity Right Eye Distance:   Left Eye Distance:   Bilateral Distance:    Right Eye Near:   Left Eye Near:    Bilateral Near:     Physical Exam Nursing notes and Vital Signs reviewed. Appearance:  Patient appears stated age, and in no acute distress Eyes:  Pupils are equal, round, and reactive to light and accomodation.  Extraocular movement is intact.  Conjunctivae are not inflamed  Ears:  Canals normal.  Tympanic membranes normal.  Nose:  Mildly congested turbinates.  No sinus tenderness.  Pharynx:  Normal Neck:  Supple.  Mildly enlarged lateral nodes are present, tender to palpation on the left.   Lungs:  Clear to auscultation.  Breath sounds are equal.  Moving air well. Heart:  Regular rate and rhythm without murmurs, rubs, or gallops.  Abdomen:  Nontender without masses or hepatosplenomegaly.  Bowel sounds are present.  No CVA or flank tenderness.  Extremities:  No edema.  Skin:  No rash present.   UC Treatments / Results    Labs (all labs ordered are listed, but only abnormal results are displayed) Labs Reviewed  NOVEL CORONAVIRUS, NAA    EKG   Radiology No results found.  Procedures Procedures (including critical care time)  Medications Ordered in UC Medications - No data to display  Initial Impression / Assessment and Plan / UC Course  I have reviewed the triage vital signs and the nursing notes.  Pertinent labs & imaging results that were available during my care of the patient were reviewed by me and considered in my medical decision making (see chart for details).    Benign exam.  There is no evidence of bacterial infection today.  Treat symptomatically for now.  COVID19 PCR pending    Final Clinical Impressions(s) / UC Diagnoses   Final diagnoses:  Close exposure to COVID-19 virus  Viral URI with cough     Discharge Instructions     Take plain guaifenesin (1200mg  extended release tabs such as Mucinex) twice daily, with plenty of water, for cough and congestion.  May add Pseudoephedrine (30mg , one or two every 4 to 6 hours) for sinus congestion.  Get adequate rest.   May use Afrin nasal spray (or generic oxymetazoline) each morning for about 5 days and then discontinue.  Also recommend using saline nasal spray several times daily and saline nasal irrigation (AYR is a common brand).  Use Flonase nasal spray each morning after using Afrin nasal spray and saline nasal irrigation. Try warm salt water gargles for sore throat.  Stop all antihistamines for now, and other non-prescription cough/cold preparations. May take Ibuprofen 200mg , 4 tabs every 8 hours with food for chest/sternum discomfort, body aches, fever, etc. May take Delsym Cough Suppressant ("12 Hour Cough Relief") at bedtime for nighttime cough.   Isolate yourself until COVID-19 test result is available.   If your COVID19 test is positive, then you are infected with the novel coronavirus and could give the virus to others.   Please continue isolation at home for at least 10 days since the start of your symptoms.  Once you complete your 10 day quarantine, you may return to normal activities as long as  you've not had a fever for over 24 hours (without taking fever reducing medicine) and your symptoms are improving. Please continue good preventive care measures, including:  frequent hand-washing, avoid touching your face, cover coughs/sneezes, stay out of crowds and keep a 6 foot distance from others.  Go to the nearest hospital emergency room if fever/cough/breathlessness are severe or illness seems like a threat to life.     ED Prescriptions    None        Lattie Haw, MD 08/05/20 2126

## 2020-08-05 LAB — NOVEL CORONAVIRUS, NAA: SARS-CoV-2, NAA: NOT DETECTED

## 2020-08-05 LAB — SARS-COV-2, NAA 2 DAY TAT

## 2020-08-30 ENCOUNTER — Telehealth: Payer: 59 | Admitting: Emergency Medicine

## 2020-08-30 DIAGNOSIS — K6289 Other specified diseases of anus and rectum: Secondary | ICD-10-CM

## 2020-08-30 MED ORDER — HYDROCORTISONE (PERIANAL) 2.5 % EX CREA: 1.0000 "application " | TOPICAL_CREAM | Freq: Two times a day (BID) | CUTANEOUS | 0 refills | Status: DC

## 2020-08-30 NOTE — Progress Notes (Signed)
E-Visit for Hemorrhoid  We are sorry that you are not feeling well. We are here to help!  Hemorrhoids are swollen veins in the rectum. They can cause itching, bleeding, and pain. Hemorrhoids are very common.  In some cases, you can see or feel hemorrhoids around the outside of the rectum. In other cases, you cannot see them because they are hidden inside the rectum. Be patient - It can take months for this to improve or go away.  If you have significant bleeding, fever, or discharge, you should be seen in person.     Hemorrhoids do not always cause symptoms. But when they do, symptoms can include: ?Itching of the skin around the anus ?Bleeding - Bleeding is usually painless. You might see bright red blood after using the toilet. ?Pain - If a blood clot forms inside a hemorrhoid, this can cause pain. It can also cause a lump that you might be able to feel.   What can I do to keep from getting more hemorrhoids? -- The most important thing you can do is to keep from getting constipated. You should have a bowel movement at least a few times a week. When you have a bowel movement, you also should not have to push too much. Plus, your bowel movements should not be too hard. Being constipated and having hard bowel movements can make hemorrhoids worse.   I have prescribed Topical Hydrocortisone ointment 2.5%.  Apply to area two times per day for 30 days  HOME CARE: . Sitz Baths twice daily. Soak buttocks in 2 or 3 inches of warm water for 10 to 15 minutes. Do not add soap, bubble bath, or anything to the water. . Stool softener such as Colace 100 mg twice daily AND Miralax 1 scoop daily until you have regular soft stools . Over the counter Preparation H . Tucks Pads . Witch Hazel  Here are some steps you can take to avoid getting constipated or having hard stools:  ?Eat lots of fruits, vegetables, and other foods with fiber. Fiber helps to increase bowel movements. If you do not get enough fiber  from your diet, you can take fiber supplements. These come in the form of powders, wafers, or pills. Some examples are Metamucil, Citrucel, Benefiber and FiberCon. If you take a fiber supplement, be sure to read the label so you know how much to take. If you're not sure, ask your provider or nurse. ?Take medicines called "stool softeners" such as docusate sodium (sample brand names: Colace, Dulcolax). These medicines increase the number of bowel movements you have. They are safe to take and they can prevent problems later.  You should request a referral for a follow up evaluation with a Gastroenterologist (GI doctor) to evaluate this chronic and relapsing condition - even if it improves to see what further steps need to be taken. This is highly linked to chronic constipation and straining to have a bowel movement. It may require further treatment or surgical intervention.   GET HELP RIGHT AWAY IF: . You develop severe pain . You have heavy bleeding   FOLLOW UP WITH YOUR PRIMARY PROVIDER IF: . If your symptoms do not improve within 10 days  MAKE SURE YOU   Understand these instructions.  Will watch your condition.  Will get help right away if you are not doing well or get worse.  Your e-visit answers were reviewed by a board certified advanced clinical practitioner to complete your personal care plan. Depending upon the  condition, your plan could have included both over the counter or prescription medications.  Your safety is important to [place]. If you have drug allergies check your prescription carefully.   You can use MyChart to ask questions about today's visit, request a non-urgent call back, or ask for a work or school excuse for 24 hours related to this e-Visit. If it has been greater than 24 hours you will need to follow up with your provider, or enter a new e-Visit to address those concerns.  You will get an e-mail with a link to a survey asking about your experience.  We hope that your  e-visit has been valuable and will speed your recovery! Thank you for using e-visits.   Approximately 5 minutes was used in reviewing the patient's chart, questionnaire, prescribing medications, and documentation.

## 2020-09-06 DIAGNOSIS — U071 COVID-19: Secondary | ICD-10-CM

## 2020-09-06 HISTORY — DX: COVID-19: U07.1

## 2020-09-07 ENCOUNTER — Telehealth: Payer: 59 | Admitting: Physician Assistant

## 2020-09-07 ENCOUNTER — Telehealth: Payer: Self-pay | Admitting: Orthopaedic Surgery

## 2020-09-07 DIAGNOSIS — U071 COVID-19: Secondary | ICD-10-CM

## 2020-09-07 MED ORDER — BENZONATATE 100 MG PO CAPS: 100.0000 mg | ORAL_CAPSULE | Freq: Three times a day (TID) | ORAL | 0 refills | Status: AC

## 2020-09-07 NOTE — Progress Notes (Signed)
E-Visit for Corona Virus Screening  We are sorry you are not feeling well. We are here to help!  Firstly, I have sent a work note given your positive test.  You have been enrolled in MyChart Home Monitoring for COVID-19. Daily you will receive a questionnaire within the MyChart website. Our COVID-19 response team will be monitoring your responses daily.  Please continue isolation at home, for at least 10 days since the start of your symptoms and until you have had 24 hours with no fever (without taking a fever reducer) and with improving of symptoms.  Please continue good preventive care measures, including:  frequent hand-washing, avoid touching your face, cover coughs/sneezes, stay out of crowds and keep a 6 foot distance from others.  Follow up with your provider or go to the nearest hospital ED for re-assessment if fever/cough/breathlessness return.  The following symptoms may appear 2-14 days after exposure: . Fever . Cough . Shortness of breath or difficulty breathing . Chills . Repeated shaking with chills . Muscle pain . Headache . Sore throat . New loss of taste or smell . Fatigue . Congestion or runny nose . Nausea or vomiting . Diarrhea  Go to the nearest hospital ED for assessment if fever/cough/breathlessness are severe or illness seems like a threat to life.  It is vitally important that if you feel that you have an infection such as this virus or any other virus that you stay home and away from places where you may spread it to others.  You should avoid contact with people age 79 and older.   You can use medication such as A prescription cough medication called Tessalon Perles 100 mg. You may take 1-2 capsules every 8 hours as needed for cough  You may also take acetaminophen (Tylenol) as needed for fever.  Reduce your risk of any infection by using the same precautions used for avoiding the common cold or flu:  [name] Wash your hands often with soap and warm water for at  least 20 seconds.  If soap and water are not readily available, use an alcohol-based hand sanitizer with at least 60% alcohol.  . If coughing or sneezing, cover your mouth and nose by coughing or sneezing into the elbow areas of your shirt or coat, into a tissue or into your sleeve (not your hands). . Avoid shaking hands with others and consider head nods or verbal greetings only. . Avoid touching your eyes, nose, or mouth with unwashed hands.  . Avoid close contact with people who are sick. . Avoid places or events with large numbers of people in one location, like concerts or sporting events. . Carefully consider travel plans you have or are making. . If you are planning any travel outside or inside the [place], visit the CDC's Travelers' Health webpage for the latest health notices. . If you have some symptoms but not all symptoms, continue to monitor at home and seek medical attention if your symptoms worsen. . If you are having a medical emergency, call 911.  HOME CARE . Only take medications as instructed by your medical team. . Drink plenty of fluids and get plenty of rest. . A steam or ultrasonic humidifier can help if you have congestion.   GET HELP RIGHT AWAY IF YOU HAVE EMERGENCY WARNING SIGNS** FOR COVID-19. If you or someone is showing any of these signs seek emergency medical care immediately. Call 911 or proceed to your closest emergency facility if: . You develop worsening high fever. [name]  Trouble breathing . Bluish lips or face . Persistent pain or pressure in the chest . New confusion . Inability to wake or stay awake . You cough up blood. . Your symptoms become more severe .        Inability to hold down food or fluids **This list is not all possible symptoms. Contact your medical provider for any symptoms that are sever or concerning to you.  MAKE SURE YOU   Understand these instructions.  Will watch your condition.  Will get help right away if you are not doing well or  get worse.  Your e-visit answers were reviewed by a board certified advanced clinical practitioner to complete your personal care plan.  Depending on the condition, your plan could have included both over the counter or prescription medications.  If there is a problem please reply once you have received a response from your provider.  Your safety is important to [place].  If you have drug allergies check your prescription carefully.    You can use MyChart to ask questions about today's visit, request a non-urgent call back, or ask for a work or school excuse for 24 hours related to this e-Visit. If it has been greater than 24 hours you will need to follow up with your provider, or enter a new e-Visit to address those concerns. You will get an e-mail in the next two days asking about your experience.  I hope that your e-visit has been valuable and will speed your recovery. Thank you for using e-visits.  Approximately 5 minutes was spent documenting and reviewing patient's chart.

## 2020-09-07 NOTE — Telephone Encounter (Signed)
Called patient left message on voicemail to return call to schedule an appointment with Dr Ophelia Charter for neck and shoulder pain per mychart message

## 2020-09-13 ENCOUNTER — Telehealth (INDEPENDENT_AMBULATORY_CARE_PROVIDER_SITE_OTHER): Payer: 59 | Admitting: Family Medicine

## 2020-09-13 ENCOUNTER — Encounter: Payer: Self-pay | Admitting: Family Medicine

## 2020-09-13 DIAGNOSIS — U071 COVID-19: Secondary | ICD-10-CM

## 2020-09-13 NOTE — Progress Notes (Signed)
Subjective:    Patient ID: Linda Hopkins, female    DOB: 1981/08/29, 39 y.o.   MRN: [id number]  HPI Virtual Visit via Video Note  I connected with the patient on 09/13/20 at  9:30 AM EST by a video enabled telemedicine application and verified that I am speaking with the correct person using two identifiers.  Location patient: home Location provider:work or home office Persons participating in the virtual visit: patient, provider  I discussed the limitations of evaluation and management by telemedicine and the availability of in person appointments. The patient expressed understanding and agreed to proceed.   HPI: Here for symptoms of a Covid-19 infection. On 09-07-20 she tested positive for the virus at a [facility]. For the past week she has had stuffy head, PND, body aches, a dry cough, and diarrhea. No fever or SOB or chest pain. She is drinking fluids and taking Dayquil. She was written a note to stay out of work by the [facility] which runs out today, but she asks to be able to stay home the rest of this week to recuperate. She is quarantining in her bedroom at home and her husband is bringing her food.    ROS: See pertinent positives and negatives per HPI.  History reviewed. No pertinent past medical history.  Past Surgical History:  Procedure Laterality Date  . CESAREAN SECTION     x 2  . DILATION AND CURETTAGE OF UTERUS    . TUBAL LIGATION      Family History  Problem Relation Age of Onset  . Breast cancer Maternal Grandfather   . Cancer Mother      Current Outpatient Medications:  .  hydrocortisone (ANUSOL-HC) 2.5 % rectal cream, Place 1 application rectally 2 (two) times daily., Disp: 30 g, Rfl: 0 .  ibuprofen (ADVIL) 600 MG tablet, Take by mouth., Disp: , Rfl:   EXAM:  VITALS per patient if applicable:  GENERAL: alert, oriented, appears well and in no acute distress  HEENT: atraumatic, conjunttiva clear, no obvious abnormalities on inspection of  external nose and ears  NECK: normal movements of the head and neck  LUNGS: on inspection no signs of respiratory distress, breathing rate appears normal, no obvious gross SOB, gasping or wheezing  CV: no obvious cyanosis  MS: moves all visible extremities without noticeable abnormality  PSYCH/NEURO: pleasant and cooperative, no obvious depression or anxiety, speech and thought processing grossly intact  ASSESSMENT AND PLAN: She has a non-complicated Covid-19 infection. She will continue her cuurent measures. Written out of work until 09-20-20.  Gershon Crane, MD  Discussed the following assessment and plan:  No diagnosis found.     I discussed the assessment and treatment plan with the patient. The patient was provided an opportunity to ask questions and all were answered. The patient agreed with the plan and demonstrated an understanding of the instructions.   The patient was advised to call back or seek an in-person evaluation if the symptoms worsen or if the condition fails to improve as anticipated.     Review of Systems     Objective:   Physical Exam        Assessment & Plan:

## 2020-09-16 ENCOUNTER — Ambulatory Visit: Payer: 59 | Admitting: Surgery

## 2020-09-23 ENCOUNTER — Ambulatory Visit: Payer: 59 | Admitting: Surgery

## 2020-09-23 ENCOUNTER — Encounter: Payer: Self-pay | Admitting: Surgery

## 2020-09-23 ENCOUNTER — Ambulatory Visit: Payer: Self-pay

## 2020-09-23 VITALS — BP 106/71 | HR 86 | Ht 64.0 in | Wt 138.0 lb

## 2020-09-23 DIAGNOSIS — M5412 Radiculopathy, cervical region: Secondary | ICD-10-CM | POA: Diagnosis not present

## 2020-09-23 DIAGNOSIS — M4802 Spinal stenosis, cervical region: Secondary | ICD-10-CM
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
## 2020-10-06 ENCOUNTER — Encounter: Payer: Self-pay | Admitting: Surgery

## 2020-10-06 ENCOUNTER — Ambulatory Visit (INDEPENDENT_AMBULATORY_CARE_PROVIDER_SITE_OTHER): Payer: 59 | Admitting: Surgery

## 2020-10-06 ENCOUNTER — Other Ambulatory Visit: Payer: Self-pay

## 2020-10-06 DIAGNOSIS — M5412 Radiculopathy, cervical region: Secondary | ICD-10-CM

## 2020-10-11 ENCOUNTER — Other Ambulatory Visit: Payer: Self-pay | Admitting: Orthopaedic Surgery

## 2020-10-11 ENCOUNTER — Telehealth: Payer: Self-pay | Admitting: Orthopaedic Surgery

## 2020-10-11 NOTE — Telephone Encounter (Signed)
Please advise 

## 2020-10-11 NOTE — Telephone Encounter (Signed)
Yes - - OK - thank you

## 2020-10-11 NOTE — Telephone Encounter (Signed)
Patient called advised she was not pleased with the providers that she saw and would like to be referred to another practice. Patient asked if she can be discharged from the practice and referred to another Orthopedic group. Patient said she do not want to have surgery when there are other options. The number to contact patient is [phone]

## 2020-10-12 NOTE — Telephone Encounter (Signed)
I called patient and advised that she can be seen by whomever she likes. I did explain that she may need to pick up copies of her office notes and x-rays to take to that appointment and that we would be glad to get these ready for her when needed. She states that she will let us know if she needs that once she finds a doctor that does not require a referral. I asked if she knew which office she was wanting to be seen with and she stated she would just call back whenever she figured it out and hung up on me.

## 2020-10-13 ENCOUNTER — Other Ambulatory Visit: Payer: 59

## 2020-10-16 ENCOUNTER — Other Ambulatory Visit: Payer: 59

## 2020-10-19 ENCOUNTER — Ambulatory Visit: Payer: 59 | Admitting: Orthopaedic Surgery

## 2020-11-04 ENCOUNTER — Encounter: Payer: Self-pay | Admitting: Physician Assistant

## 2020-11-04 ENCOUNTER — Telehealth: Payer: 59 | Admitting: Physician Assistant

## 2020-11-04 DIAGNOSIS — R197 Diarrhea, unspecified: Secondary | ICD-10-CM

## 2020-11-04 DIAGNOSIS — R102 Pelvic and perineal pain: Secondary | ICD-10-CM

## 2020-11-04 DIAGNOSIS — R1110 Vomiting, unspecified: Secondary | ICD-10-CM

## 2020-11-04 DIAGNOSIS — N939 Abnormal uterine and vaginal bleeding, unspecified: Secondary | ICD-10-CM

## 2020-11-04 MED ORDER — ONDANSETRON HCL 4 MG PO TABS: 4.0000 mg | ORAL_TABLET | Freq: Three times a day (TID) | ORAL | 0 refills | Status: DC | PRN

## 2020-11-04 NOTE — Progress Notes (Signed)
Hi Linda Hopkins,   I am sorry you aren't feeling well. Unfortunately, your symptoms are too complex for an E-visit.  I would feel more comfortable if you were evaluated in person by a medical provider.  You need a physical exam, perhaps some blood work and maybe imaging.   Based on what you shared with me, I feel your condition warrants further evaluation and I recommend that you be seen for a face to face office visit.   NOTE: If you entered your credit card information for this eVisit, you will not be charged. You may see a "hold" on your card for the $35 but that hold will drop off and you will not have a charge processed.   If you are having a true medical emergency please call 911.      For an urgent face to face visit, [place] has five urgent care centers for your convenience:     [facility] at [facility] Directions [phone] [address] [place], [place] [address] . 10 am - 6pm Monday - Friday    [facility]) Get Driving Directions [phone] [address] [place], [place] [address] . 10 am to 8 pm Monday-Friday . 12 pm to 8 pm [facility] at [facility] Get Driving Directions [phone] [address], [address] [place], [place] [address] . 8 am to 8 pm Monday-Friday . 9 am to 6 pm Saturday . 11 am to 6 pm Sunday     [facility] at [facility] Get Driving Directions  [phone] [address] [place], [place] [address] . 8 am to 8 pm Monday-Friday . 8 am to 4 pm [facility] at [facility] Directions [phone] [address] [place], [place] [address] . 12 pm to 6 pm Monday-Friday      Your e-visit answers were reviewed by a board certified advanced clinical practitioner to complete your personal care plan.  Thank you for using e-Visits.

## 2020-11-04 NOTE — Progress Notes (Signed)
We are sorry that you are not feeling well. Here is how we plan to help!  Based on what you have shared with me it looks like you have a Virus that is irritating your GI tract.  Vomiting is the forceful emptying of a portion of the stomach's content through the mouth.  Although nausea and vomiting can make you feel miserable, it's important to remember that these are not diseases, but rather symptoms of an underlying illness.  When we treat short term symptoms, we always caution that any symptoms that persist should be fully evaluated in a medical office.  I have prescribed a medication that will help alleviate your symptoms and allow you to stay hydrated:  Zofran 4 mg 1 tablet every 8 hours as needed for nausea and vomiting  HOME CARE:  Drink clear liquids.  This is very important! Dehydration (the lack of fluid) can lead to a serious complication.  Start off with 1 tablespoon every 5 minutes for 8 hours.  You may begin eating bland foods after 8 hours without vomiting.  Start with saltine crackers, white bread, rice, mashed potatoes, applesauce.  After 48 hours on a bland diet, you may resume a normal diet.  Try to go to sleep.  Sleep often empties the stomach and relieves the need to vomit.  GET HELP RIGHT AWAY IF:   Your symptoms do not improve or worsen within 2 days after treatment.  You have a fever for over 3 days.  You cannot keep down fluids after trying the medication.  MAKE SURE YOU:   Understand these instructions.  Will watch your condition.  Will get help right away if you are not doing well or get worse.   Thank you for choosing an e-visit. Your e-visit answers were reviewed by a board certified advanced clinical practitioner to complete your personal care plan. Depending upon the condition, your plan could have included both over the counter or prescription medications. Please review your pharmacy choice. Be sure that the pharmacy you have chosen is open so  that you can pick up your prescription now.  If there is a problem you may message your provider in MyChart to have the prescription routed to another pharmacy. Your safety is important to [place]. If you have drug allergies check your prescription carefully.  For the next 24 hours, you can use MyChart to ask questions about today's visit, request a non-urgent call back, or ask for a work or school excuse from your e-visit provider. You will get an e-mail in the next two days asking about your experience. I hope that your e-visit has been valuable and will speed your recovery.  Greater than 5 minutes, yet less than 10 minutes of time have been spent researching, coordinating, and implementing care for this patient today.  Jarold Motto PA-C

## 2020-12-01 ENCOUNTER — Telehealth: Payer: 59 | Admitting: Family Medicine

## 2020-12-01 ENCOUNTER — Telehealth: Payer: 59 | Admitting: Family

## 2020-12-01 DIAGNOSIS — M549 Dorsalgia, unspecified: Secondary | ICD-10-CM

## 2020-12-01 DIAGNOSIS — J069 Acute upper respiratory infection, unspecified: Secondary | ICD-10-CM

## 2020-12-01 MED ORDER — BENZONATATE 100 MG PO CAPS: 100.0000 mg | ORAL_CAPSULE | Freq: Three times a day (TID) | ORAL | 0 refills | Status: DC | PRN

## 2020-12-01 MED ORDER — FLUTICASONE PROPIONATE 50 MCG/ACT NA SUSP: 2.0000 | Freq: Every day | NASAL | 6 refills | Status: DC

## 2020-12-01 NOTE — Progress Notes (Signed)
We are sorry you are not feeling well.  Here is how we plan to help!  Based on what you have shared with me, it looks like you may have a viral upper respiratory infection.  Upper respiratory infections are caused by a large number of viruses; however, rhinovirus is the most common cause.   Given your symptoms you also need to be tested for COVID to rule out.   Symptoms vary from person to person, with common symptoms including sore throat, cough, fatigue or lack of energy and feeling of general discomfort.  A low-grade fever of up to 100.4 may present, but is often uncommon.  Symptoms vary however, and are closely related to a person's age or underlying illnesses.  The most common symptoms associated with an upper respiratory infection are nasal discharge or congestion, cough, sneezing, headache and pressure in the ears and face.  These symptoms usually persist for about 3 to 10 days, but can last up to 2 weeks.  It is important to know that upper respiratory infections do not cause serious illness or complications in most cases.    Upper respiratory infections can be transmitted from person to person, with the most common method of transmission being a person's hands.  The virus is able to live on the skin and can infect other persons for up to 2 hours after direct contact.  Also, these can be transmitted when someone coughs or sneezes; thus, it is important to cover the mouth to reduce this risk.  To keep the spread of the illness at bay, good hand hygiene is very important.  This is an infection that is most likely caused by a virus. There are no specific treatments other than to help you with the symptoms until the infection runs its course.  We are sorry you are not feeling well.  Here is how we plan to help!   For nasal congestion, you may use an oral decongestants such as Mucinex D or if you have glaucoma or high blood pressure use plain Mucinex.  Saline nasal spray or nasal drops can help and  can safely be used as often as needed for congestion.  For your congestion, I have prescribed Fluticasone nasal spray one spray in each nostril twice a day  If you do not have a history of heart disease, hypertension, diabetes or thyroid disease, prostate/bladder issues or glaucoma, you may also use Sudafed to treat nasal congestion.  It is highly recommended that you consult with a pharmacist or your primary care physician to ensure this medication is safe for you to take.     If you have a cough, you may use cough suppressants such as Delsym and Robitussin.  If you have glaucoma or high blood pressure, you can also use Coricidin HBP.   For cough I have prescribed for you A prescription cough medication called Tessalon Perles 100 mg. You may take 1-2 capsules every 8 hours as needed for cough  If you have a sore or scratchy throat, use a saltwater gargle-  to  teaspoon of salt dissolved in a 4-ounce to 8-ounce glass of warm water.  Gargle the solution for approximately 15-30 seconds and then spit.  It is important not to swallow the solution.  You can also use throat lozenges/cough drops and Chloraseptic spray to help with throat pain or discomfort.  Warm or cold liquids can also be helpful in relieving throat pain.  For headache, pain or general discomfort, you can use Ibuprofen  or Tylenol as directed.   Some authorities believe that zinc sprays or the use of Echinacea may shorten the course of your symptoms.   HOME CARE . Only take medications as instructed by your medical team. . Be sure to drink plenty of fluids. Water is fine as well as fruit juices, sodas and electrolyte beverages. You may want to stay away from caffeine or alcohol. If you are nauseated, try taking small sips of liquids. How do you know if you are getting enough fluid? Your urine should be a pale yellow or almost colorless. . Get rest. . Taking a steamy shower or using a humidifier may help nasal congestion and ease sore  throat pain. You can place a towel over your head and breathe in the steam from hot water coming from a faucet. . Using a saline nasal spray works much the same way. . Cough drops, hard candies and sore throat lozenges may ease your cough. . Avoid close contacts especially the very young and the elderly . Cover your mouth if you cough or sneeze . Always remember to wash your hands.   GET HELP RIGHT AWAY IF: . You develop worsening fever. . If your symptoms do not improve within 10 days . You develop yellow or green discharge from your nose over 3 days. . You have coughing fits . You develop a severe head ache or visual changes. . You develop shortness of breath, difficulty breathing or start having chest pain . Your symptoms persist after you have completed your treatment plan  MAKE SURE YOU   Understand these instructions.  Will watch your condition.  Will get help right away if you are not doing well or get worse.  Your e-visit answers were reviewed by a board certified advanced clinical practitioner to complete your personal care plan. Depending upon the condition, your plan could have included both over the counter or prescription medications. Please review your pharmacy choice. If there is a problem, you may call our nursing hot line at and have the prescription routed to another pharmacy. Your safety is important to [place]. If you have drug allergies check your prescription carefully.   You can use MyChart to ask questions about today's visit, request a non-urgent call back, or ask for a work or school excuse for 24 hours related to this e-Visit. If it has been greater than 24 hours you will need to follow up with your provider, or enter a new e-Visit to address those concerns. You will get an e-mail in the next two days asking about your experience.  I hope that your e-visit has been valuable and will speed your recovery. Thank you for using e-visits.   Approximately 5 minutes was  spent documenting and reviewing patient's chart.

## 2020-12-01 NOTE — Progress Notes (Signed)
Hello, I am sorry, but can not give you a note for work given you just completed an Evisit for cough. We have very strict policies for Evisits. I hope you feel better soon!   Jannifer Rodney, FNP

## 2020-12-02 ENCOUNTER — Ambulatory Visit: Payer: Self-pay

## 2020-12-07 NOTE — Progress Notes (Signed)
Patient returns to clinic today with complaints of increased neck pain and left upper extremity radiculopathy.  I previously seen patient September 23, 2020.  Dr. Ophelia Charter is also seen patient in the past and I documented that he had previously recommended surgery for cervical HNP but patient did not want to have this done.  She has MRI scheduled October 13, 2020 and follow-up with Dr. Ophelia Charter October 19, 2020.  Patient states that she wants something for pain and again expresses that she does not want to have surgery.  I advised her that it is my best recommendation at this point to proceed with the MRI cervical spine that is ordered and have her follow-up with Dr. Ophelia Charter as already scheduled so we can review the study and discuss surgical versus nonsurgical options.  No charge patient for today's visit.  No narcotic medication prescribed.

## 2021-01-17 ENCOUNTER — Telehealth: Payer: 59 | Admitting: Physician Assistant

## 2021-01-17 DIAGNOSIS — M5412 Radiculopathy, cervical region: Secondary | ICD-10-CM

## 2021-01-17 NOTE — Progress Notes (Signed)
Based on what you shared with me, I feel your condition warrants further evaluation and I recommend that you be seen for a face to face office visit. Giving history of disc issue with associated numbness with the pain, it is in your best interest to be seen in person or at least by video with your primary care provider (Dr. Elvis Coil office offers this). This is to make sure no repeat imaging or other assessment is needed and so the most appropriate treatment can be given.    NOTE: If you entered your credit card information for this eVisit, you will not be charged. You may see a "hold" on your card for the $35 but that hold will drop off and you will not have a charge processed.   If you are having a true medical emergency please call 911.      For an urgent face to face visit,  has five urgent care centers for your convenience:     [facility] at [facility] Directions [phone] [address] [place], [place] [address] . 10 am - 6pm Monday - Friday    [facility] Ur[facility] Care Center [facility]) Get Driving Directions [phone] [address] [place], [place] [address] . 10 am to 8 pm Monday-Friday . 12 pm to 8 pm [facility] Urgent Care at [facility] Get Driving Directions [phone] [address] [place], [place] [address] . 8 am to 8 pm Monday-Friday . 9 am to 6 pm Saturday . 11 am to 6 pm Sunday     [facility] Ur[facility] Care at [facility] Get Driving Directions  [phone] [address] [place], [place] [address] . 8 am to 8 pm Monday-Friday . 8 am to 4 pm [facility] Urgent Care at [facility] Directions [phone] [address] [place], [place] [address] . 12 pm to 6 pm Monday-Friday      Your e-visit answers were reviewed by a board certified advanced clinical practitioner to complete your personal  care plan.  Thank you for using e-Visits.

## 2021-02-14 ENCOUNTER — Telehealth: Payer: 59 | Admitting: Physician Assistant

## 2021-02-14 DIAGNOSIS — K649 Unspecified hemorrhoids: Secondary | ICD-10-CM | POA: Insufficient documentation

## 2021-02-14 MED ORDER — HYDROCORTISONE 2.5 % EX SOLN: CUTANEOUS | 0 refills | Status: DC

## 2021-02-14 NOTE — Progress Notes (Signed)
E-Visit for Hemorrhoid  We are sorry that you are not feeling well. We are here to help!  Hemorrhoids are swollen veins in the rectum. They can cause itching, bleeding, and pain. Hemorrhoids are very common.  In some cases, you can see or feel hemorrhoids around the outside of the rectum. In other cases, you cannot see them because they are hidden inside the rectum. Be patient - It can take months for this to improve or go away.   Hemorrhoids do not always cause symptoms. But when they do, symptoms can include: ?Itching of the skin around the anus ?Bleeding - Bleeding is usually painless. You might see bright red blood after using the toilet. ?Pain - If a blood clot forms inside a hemorrhoid, this can cause pain. It can also cause a lump that you might be able to feel.   What can I do to keep from getting more hemorrhoids? -- The most important thing you can do is to keep from getting constipated. You should have a bowel movement at least a few times a week. When you have a bowel movement, you also should not have to push too much. Plus, your bowel movements should not be too hard. Being constipated and having hard bowel movements can make hemorrhoids worse.   I have prescribed Topical Hydrocortisone ointment 2.5%.  Apply to area two times per day for 30 days  HOME CARE: . Sitz Baths twice daily. Soak buttocks in 2 or 3 inches of warm water for 10 to 15 minutes. Do not add soap, bubble bath, or anything to the water. . Stool softener such as Colace 100 mg twice daily AND Miralax 1 scoop daily until you have regular soft stools . Over the counter Preparation H . Tucks Pads . Witch Hazel  Here are some steps you can take to avoid getting constipated or having hard stools:  ?Eat lots of fruits, vegetables, and other foods with fiber. Fiber helps to increase bowel movements. If you do not get enough fiber from your diet, you can take fiber supplements. These come in the form of powders,  wafers, or pills. Some examples are Metamucil, Citrucel, Benefiber and FiberCon. If you take a fiber supplement, be sure to read the label so you know how much to take. If you're not sure, ask your provider or nurse. ?Take medicines called "stool softeners" such as docusate sodium (sample brand names: Colace, Dulcolax). These medicines increase the number of bowel movements you have. They are safe to take and they can prevent problems later.  You should request a referral for a follow up evaluation with a Gastroenterologist (GI doctor) to evaluate this chronic and relapsing condition - even if it improves to see what further steps need to be taken. This is highly linked to chronic constipation and straining to have a bowel movement. It may require further treatment or surgical intervention.   GET HELP RIGHT AWAY IF: . You develop severe pain . You have heavy bleeding   FOLLOW UP WITH YOUR PRIMARY PROVIDER IF: . If your symptoms do not improve within 10 days  MAKE SURE YOU   Understand these instructions.  Will watch your condition.  Will get help right away if you are not doing well or get worse.  Your e-visit answers were reviewed by a board certified advanced clinical practitioner to complete your personal care plan. Depending upon the condition, your plan could have included both over the counter or prescription medications.  Your safety is  important to [place]. If you have drug allergies check your prescription carefully.   You can use MyChart to ask questions about today's visit, request a non-urgent call back, or ask for a work or school excuse for 24 hours related to this e-Visit. If it has been greater than 24 hours you will need to follow up with your provider, or enter a new e-Visit to address those concerns.  You will get an e-mail with a link to a survey asking about your experience.  We hope that your e-visit has been valuable and will speed your recovery! Thank you for using  e-visits.  I provided 5 minutes of non face-to-face time during this encounter for chart review and documentation.

## 2021-03-01 IMAGING — MR MR CERVICAL SPINE W/O CM
5 series · 29 of 48 positions shown · non-contrast
Comparison: Cervical spine radiographs 03/02/2006

CLINICAL DATA: Cervical radiculopathy. Right arm numbness and
weakness.

EXAM:
MRI CERVICAL SPINE WITHOUT CONTRAST
TECHNIQUE: Multiplanar, multisequence MR imaging of the cervical spine was
performed. No intravenous contrast was administered.

[Series 2: T2 · sagittal · 3.0mm · 0.41mm/px · 6 of 13 slices shown (1 of 2)]
[im 1/13]
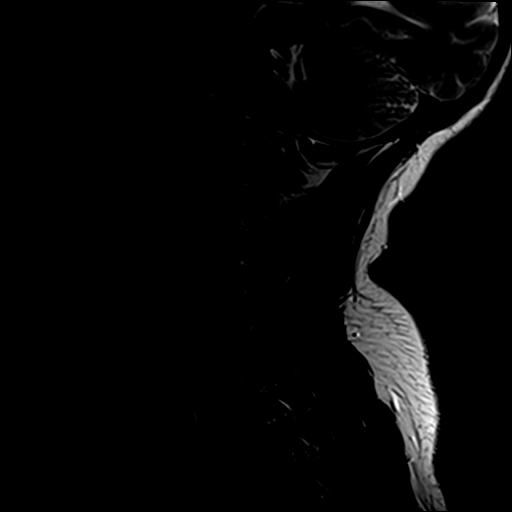
[im 3/13]
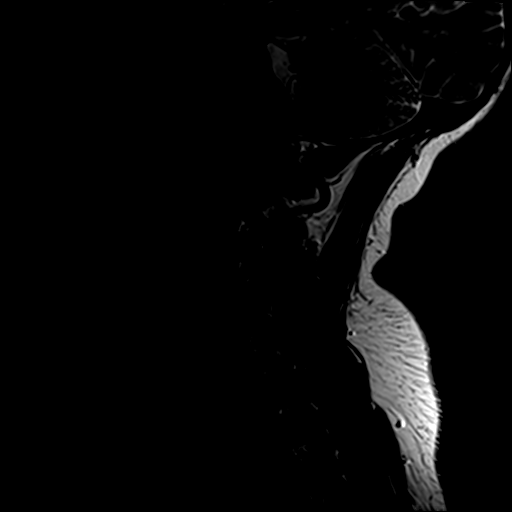
[im 5/13]
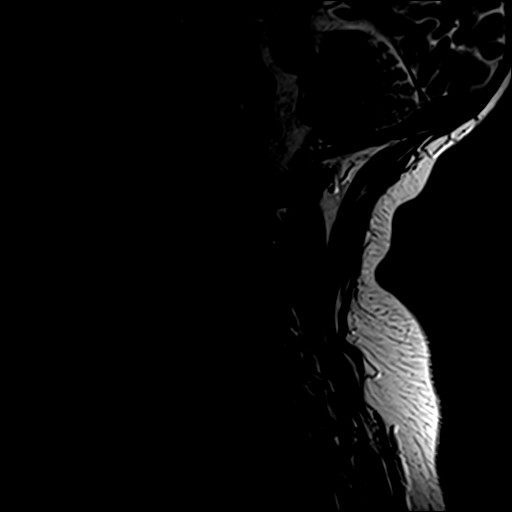
[im 8/13]
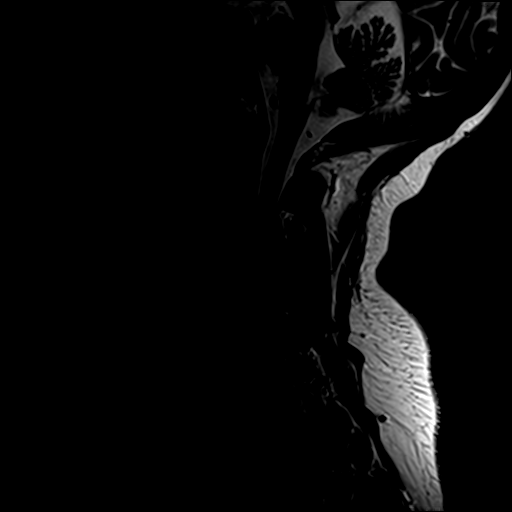
[im 10/13]
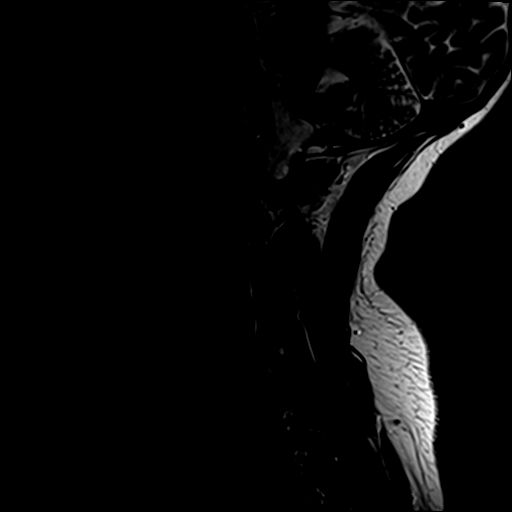
[im 13/13]
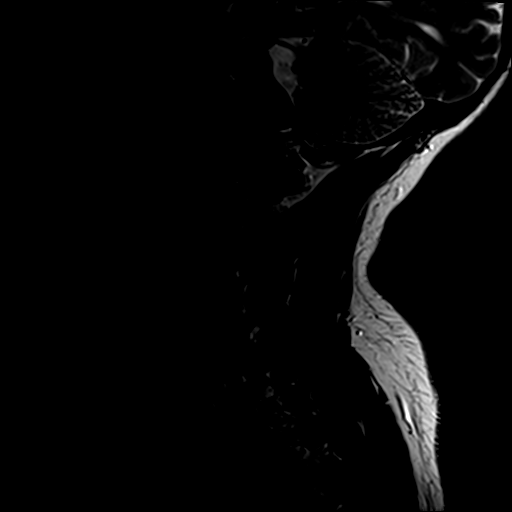

[Series 3: T1 · sagittal · 3.0mm · 0.41mm/px · 6 of 13 slices shown]
[im 1/13]
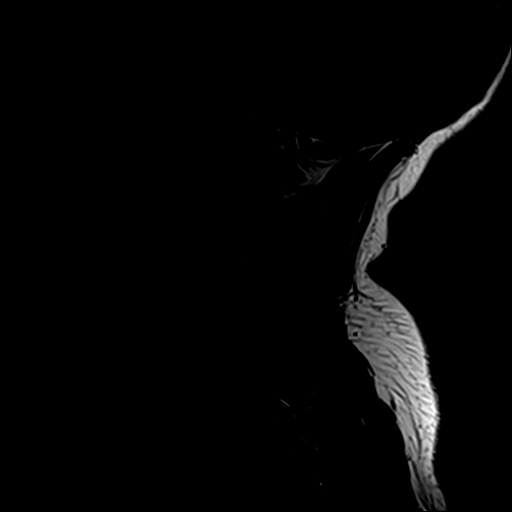
[im 3/13]
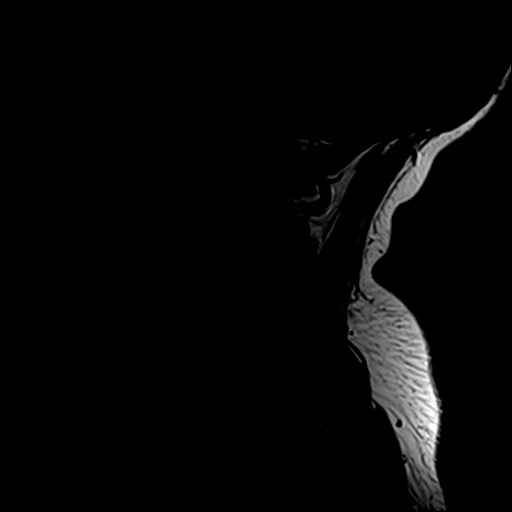
[im 5/13]
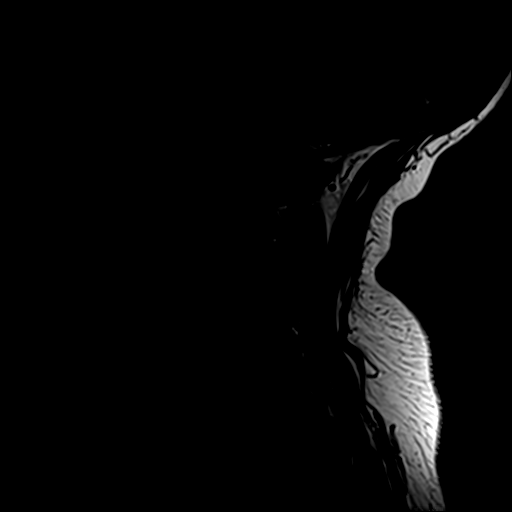
[im 8/13]
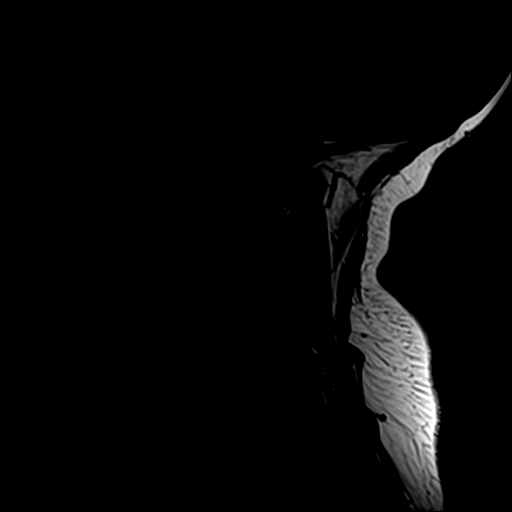
[im 10/13]
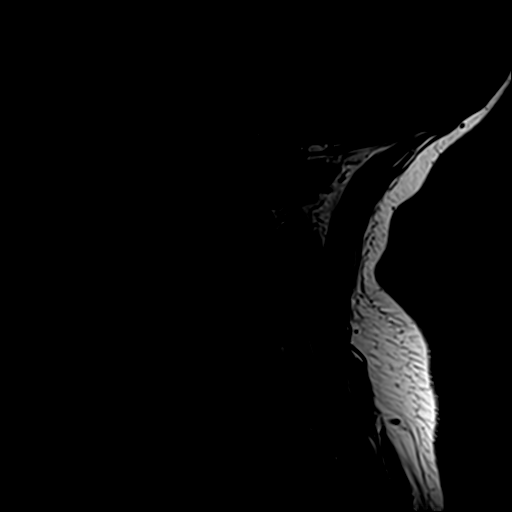
[im 13/13]
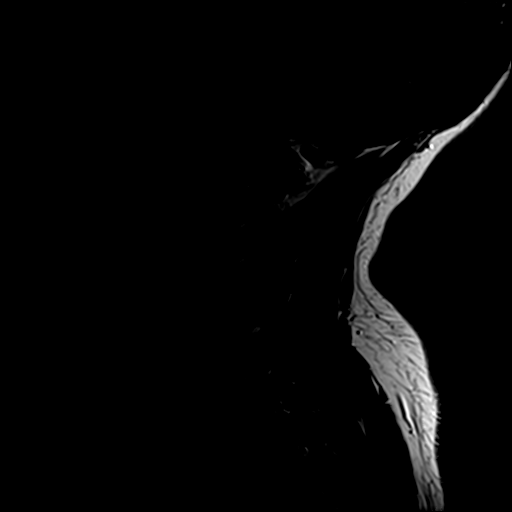

[Series 4: STIR · sagittal · 3.0mm · 0.82mm/px · 6 of 13 slices shown]
[im 1/13]
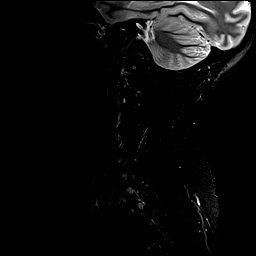
[im 3/13]
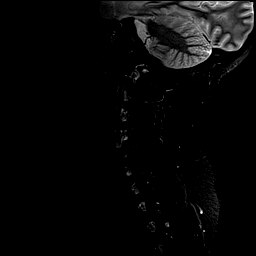
[im 5/13]
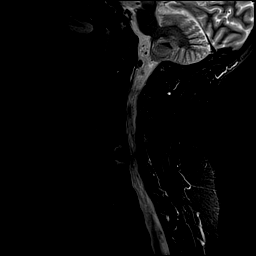
[im 8/13]
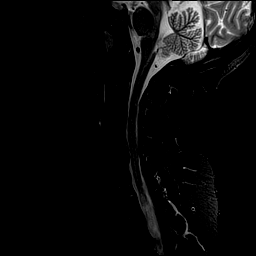
[im 10/13]
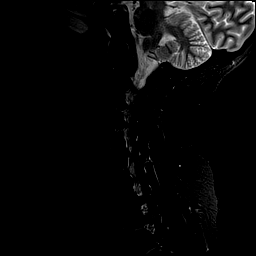
[im 13/13]
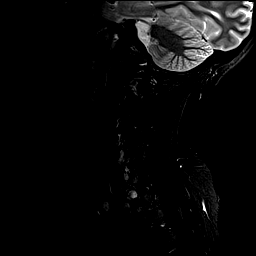

[Series 5: GRE · axial · 3.0mm · 0.35mm/px · z∈[-119,-104]mm · 2 of 32 slices shown]
[im 1/32]
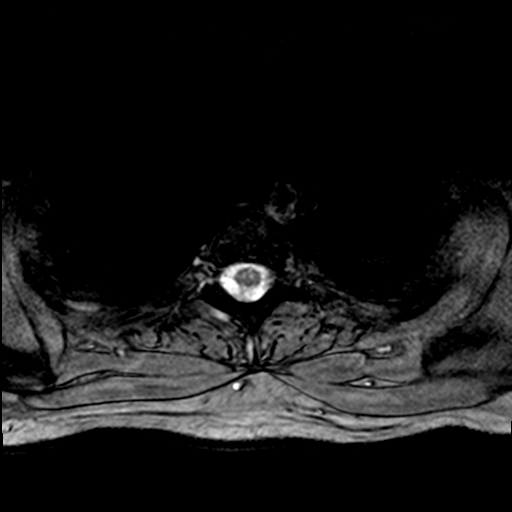
[im 5/32]
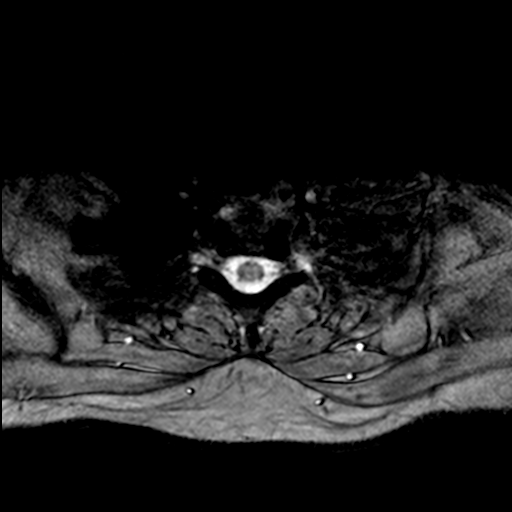

[Series 6: T2 · axial · 3.0mm · 0.70mm/px · z∈[-119,-5]mm · 9 of 32 slices shown (2 of 2)]
[im 1/32]
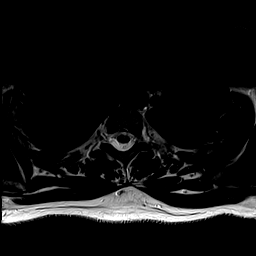
[im 5/32]
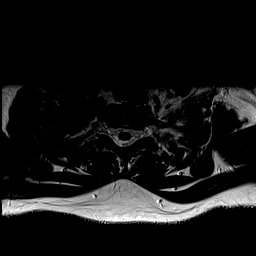
[im 9/32]
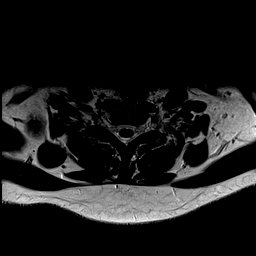
[im 14/32]
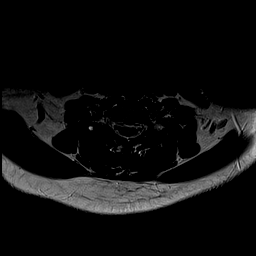
[im 16/32]
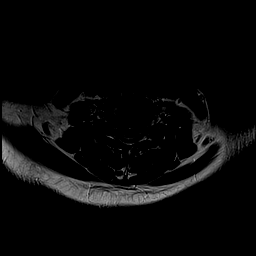
[im 18/32]
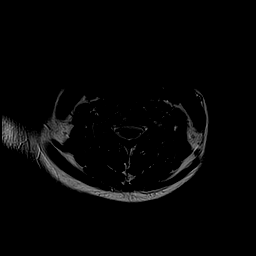
[im 23/32]
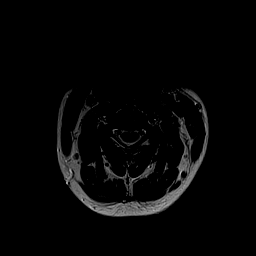
[im 27/32]
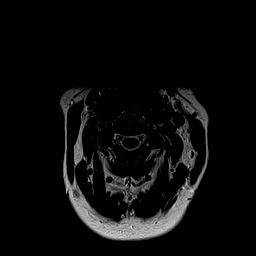
[im 32/32]
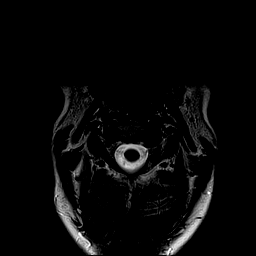

[29 of 48 positions shown; findings below may reference images not displayed]

FINDINGS: Alignment: Normal

Vertebrae: Negative for fracture or mass. Discogenic edema on the
right at C5-6.

Cord: Normal cord signal.  No cord compression or cord lesion.

Posterior Fossa, vertebral arteries, paraspinal tissues: Negative

Disc levels:

C2-3: Negative

C3-4: Small central disc protrusion without stenosis. Neural
foramina widely patent.

C4-5: Negative

C5-6: Asymmetric discogenic edema on the right. Disc degeneration
with diffuse bilateral disc protrusions and associated spurring.
Disc protrusions most prominent on the left extending into the left
foramen. There is moderate to severe foraminal encroachment
bilaterally left greater than right. Cord flattening with mild
spinal stenosis

C6-7: Negative

C7-T1: Negative
IMPRESSION: Diffuse disc protrusion at C5-6, more prominent on the left than the
right. Disc protrusion extends into the left foramen causing
moderate to severe left foraminal encroachment. Mild associated
spurring. Disc protrusion on the right also cause causes moderate to
severe right foraminal encroachment. Mild spinal stenosis at C5-6

Small central disc protrusion C3-4 without stenosis.

## 2021-03-11 ENCOUNTER — Encounter (HOSPITAL_BASED_OUTPATIENT_CLINIC_OR_DEPARTMENT_OTHER): Payer: Self-pay | Admitting: Obstetrics and Gynecology

## 2021-03-11 ENCOUNTER — Other Ambulatory Visit: Payer: Self-pay

## 2021-03-11 NOTE — Progress Notes (Signed)
Spoke w/ via phone for pre-op interview---pt Lab needs dos---- urine preg             Lab results------has lab appt 03-22-2021 900 am for cbc T & S COVID test ------03-22-2021 1030 Arrive at -------530 am 03-24-2021 NPO after MN NO Solid Food.  Clear liquids from MN until--430 am then npo Med rec completed Medications to take morning of surgery -----none Diabetic medication -----n/a Patient instructed to bring photo id and insurance card day of surgery Patient aware to have Driver (ride ) / caregiver spouse christopher will stay    for 24 hours after surgery  Patient Special Instructions -----pt given extended stay instructions Pre-Op special Istructions -----none Patient verbalized understanding of instructions that were given at this phone interview. Patient denies shortness of breath, chest pain, fever, cough at this phone interview.

## 2021-03-11 NOTE — Progress Notes (Signed)
YOU ARE SCHEDULED FOR A COVID TEST  03-22-2021 @  1030 AM. THIS TEST MUST BE DONE BEFORE SURGERY. GO TO  [address] [place], [place], IT IS APPROXIMATELY 2 MINUTES PAST [employer] ON THE RIGHT AND REMAIN IN YOUR CAR, THIS IS A DRIVE UP TEST. ONCE YOUR COVID TEST IS DONE PLEASE FOLLOW ALL THE QUARANTINE  INSTRUCTIONS GIVEN IN YOUR HANDOUT.      Your procedure is scheduled on 03-24-2021  Report to [facility] [place] AT 530  A. M.   Call this number if you have problems the morning of surgery  :[phone].   OUR ADDRESS IS [address].  WE ARE LOCATED IN [facility].  PLEASE BRING YOUR INSURANCE CARD AND PHOTO ID DAY OF SURGERY.  ONLY ONE PERSON ALLOWED IN FACILITY WAITING AREA.                                     REMEMBER:  DO NOT EAT FOOD, CANDY GUM OR MINTS  AFTER MIDNIGHT . YOU MAY HAVE CLEAR LIQUIDS FROM MIDNIGHT UNTIL 430 AM. NO CLEAR LIQUIDS AFTER  430 AM DAY OF SURGERY.   YOU MAY  BRUSH YOUR TEETH MORNING OF SURGERY AND RINSE YOUR MOUTH OUT, NO CHEWING GUM CANDY OR MINTS.    CLEAR LIQUID DIET   Foods Allowed                                                                     Foods Excluded  Coffee and tea, regular and decaf                             liquids that you cannot  Plain Jell-O any favor except red or purple                                           see through such as: Fruit ices (not with fruit pulp)                                     milk, soups, orange juice  Iced Popsicles                                    All solid food Carbonated beverages, regular and diet                                    Cranberry, grape and apple juices Sports drinks like Gatorade Lightly seasoned clear broth or consume(fat free) Sugar, honey syrup  Sample Menu Breakfast                                Lunch  Supper Cranberry juice                    Beef broth                            Chicken  broth Jell-O                                     Grape juice                           Apple juice Coffee or tea                        Jell-O                                      Popsicle                                                Coffee or tea                        Coffee or tea  _____________________________________________________________________     TAKE THESE MEDICATIONS MORNING OF SURGERY WITH A SIP OF  NONE.  ONE VISITOR IS ALLOWED IN WAITING ROOM ONLY DAY OF SURGERY.  NO VISITOR MAY SPEND THE NIGHT.  VISITOR ARE ALLOWED TO STAY UNTIL 800 PM.                                    DO NOT WEAR JEWERLY, MAKE UP. DO NOT WEAR LOTIONS, POWDERS, PERFUMES OR DEODORANT. DO NOT SHAVE FOR 24 HOURS PRIOR TO DAY OF SURGERY. MEN MAY SHAVE FACE AND NECK. CONTACTS, GLASSES, OR DENTURES MAY NOT BE WORN TO SURGERY.                                    [facility] IS NOT RESPONSIBLE  FOR ANY BELONGINGS.                                                                    [name]           [place] - Preparing for Surgery Before surgery, you can play an important role.  Because skin is not sterile, your skin needs to be as free of germs as possible.  You can reduce the number of germs on your skin by washing with CHG (chlorahexidine gluconate) soap before surgery.  CHG is an antiseptic cleaner which kills germs and bonds with the skin to continue killing germs even after washing. Please DO NOT use if you have an allergy to CHG or antibacterial soaps.  If your skin becomes reddened/irritated stop using  the CHG and inform your nurse when you arrive at Short Stay. Do not shave (including legs and underarms) for at least 48 hours prior to the first CHG shower.  You may shave your face/neck. Please follow these instructions carefully:  1.  Shower with CHG Soap the night before surgery and the  morning of Surgery.  2.  If you choose to wash your hair, wash your hair first as usual with your  normal  shampoo.  3.   After you shampoo, rinse your hair and body thoroughly to remove the  shampoo.                            4.  Use CHG as you would any other liquid soap.  You can apply chg directly  to the skin and wash                      Gently with a scrungie or clean washcloth.  5.  Apply the CHG Soap to your body ONLY FROM THE NECK DOWN.   Do not use on face/ open                           Wound or open sores. Avoid contact with eyes, ears mouth and genitals (private parts).                       Wash face,  Genitals (private parts) with your normal soap.             6.  Wash thoroughly, paying special attention to the area where your surgery  will be performed.  7.  Thoroughly rinse your body with warm water from the neck down.  8.  DO NOT shower/wash with your normal soap after using and rinsing off  the CHG Soap.                9.  Pat yourself dry with a clean towel.            10.  Wear clean pajamas.            11.  Place clean sheets on your bed the night of your first shower and do not  sleep with pets. Day of Surgery : Do not apply any lotions/deodorants the morning of surgery.  Please wear clean clothes to the hospital/surgery center.  FAILURE TO FOLLOW THESE INSTRUCTIONS MAY RESULT IN THE CANCELLATION OF YOUR SURGERY PATIENT SIGNATURE_________________________________  NURSE SIGNATURE__________________________________  ________________________________________________________________________                                                        QUESTIONS Annye Asa PRE OP NURSE PHONE [phone]

## 2021-03-22 ENCOUNTER — Other Ambulatory Visit: Payer: Self-pay

## 2021-03-22 ENCOUNTER — Encounter (HOSPITAL_COMMUNITY)
Admission: RE | Admit: 2021-03-22 | Discharge: 2021-03-22 | Disposition: A | Discharge status: Home / Self Care | Payer: 59 | Source: Ambulatory Visit | Attending: Obstetrics and Gynecology | Admitting: Obstetrics and Gynecology

## 2021-03-22 ENCOUNTER — Other Ambulatory Visit (HOSPITAL_COMMUNITY)
Admission: RE | Admit: 2021-03-22 | Discharge: 2021-03-22 | Disposition: A | Discharge status: Home / Self Care | Payer: 59 | Source: Ambulatory Visit | Attending: Obstetrics and Gynecology | Admitting: Obstetrics and Gynecology

## 2021-03-22 DIAGNOSIS — Z01812 Encounter for preprocedural laboratory examination: Secondary | ICD-10-CM | POA: Insufficient documentation

## 2021-03-22 DIAGNOSIS — Z20822 Contact with and (suspected) exposure to covid-19: Secondary | ICD-10-CM | POA: Insufficient documentation

## 2021-03-22 LAB — CBC
HCT: 44.7 % (ref 36.0–46.0)
Hemoglobin: 14.7 g/dL (ref 12.0–15.0)
MCH: 31.5 pg (ref 26.0–34.0)
MCHC: 32.9 g/dL (ref 30.0–36.0)
MCV: 95.7 fL (ref 80.0–100.0)
Platelets: 333 10*3/uL (ref 150–400)
RBC: 4.67 MIL/uL (ref 3.87–5.11)
RDW: 13.3 % (ref 11.5–15.5)
WBC: 8 10*3/uL (ref 4.0–10.5)
nRBC: 0 % (ref 0.0–0.2)

## 2021-03-22 LAB — SARS CORONAVIRUS 2 (TAT 6-24 HRS): SARS Coronavirus 2: NEGATIVE

## 2021-03-23 NOTE — H&P (Signed)
Linda Hopkins is an 40 y.o. female. She was seen for her annual exam in September.  Having regular menses monthly, can be heavy with clots, pain can be severe enough for her to miss work.  She also continues to have random pelvic pain and deep pain with coitus.  Normal pelvic ultrasound and exam.  She desires defibitive surgical therapy.  Pertinent Gynecological History: Last pap: normal Date: 07/2020 OB History: G3, P2012 LTCS x 2   Menstrual History: Patient's last menstrual period was 02/28/2021.    Past Medical History:  Diagnosis Date  . Arthritis    oa hips  . COVID 09/2020   congestion body aches x 7 days all symtposm reolved  . Pelvic pain   . Wears dentures    upper  . Wears glasses     Past Surgical History:  Procedure Laterality Date  . CESAREAN SECTION     x 2  . DILATION AND CURETTAGE OF UTERUS  2004  . TUBAL LIGATION  2011    Family History  Problem Relation Age of Onset  . Breast cancer Maternal Grandfather   . Cancer Mother     Social History:  reports that she has been smoking cigarettes. She has a 3.00 pack-year smoking history. She has never used smokeless tobacco. She reports that she does not drink alcohol and does not use drugs.  Allergies:  Allergies  Allergen Reactions  . Hydrocodone Nausea And Vomiting    No medications prior to admission.    Review of Systems  Respiratory: Negative.   Cardiovascular: Negative.     Height 5\' 4"  (1.626 m), weight 62.6 kg, last menstrual period 02/28/2021. Physical Exam Constitutional:      Appearance: Normal appearance.  Cardiovascular:     Rate and Rhythm: Normal rate and regular rhythm.     Heart sounds: Normal heart sounds. No murmur heard.   Pulmonary:     Effort: Pulmonary effort is normal. No respiratory distress.     Breath sounds: Normal breath sounds.  Abdominal:     General: There is no distension.     Palpations: Abdomen is soft. There is no mass.     Tenderness: There is no  abdominal tenderness.  Genitourinary:    Comments: Uterus normal size Mild bilateral adnexal tenderness, no mass Musculoskeletal:     Cervical back: Normal range of motion and neck supple.  Neurological:     Mental Status: She is alert.     No results found for this or any previous visit (from the past 24 hour(s)).  No results found.  Assessment/Plan: Chronic pelvic pain with dysmenorrhea and dyspareunia.  All medical and surgical options have been discussed, she wants definitive surgical therapy.  Surgical options, risks, chances of relieving her pain have all been discussed, questions answered.  Will admit for TLH, bilateral salpingectomy, cystoscopy  03/02/2021 Lania Zawistowski 03/23/2021, 11:56 PM

## 2021-03-24 ENCOUNTER — Encounter (HOSPITAL_BASED_OUTPATIENT_CLINIC_OR_DEPARTMENT_OTHER)
Admission: RE | Disposition: A | Discharge status: Home / Self Care | Payer: Self-pay | Source: Ambulatory Visit | Attending: Obstetrics and Gynecology

## 2021-03-24 ENCOUNTER — Ambulatory Visit (HOSPITAL_BASED_OUTPATIENT_CLINIC_OR_DEPARTMENT_OTHER)
Admission: RE | Admit: 2021-03-24 | Discharge: 2021-03-24 | Disposition: A | Discharge status: Home / Self Care | Payer: 59 | Source: Ambulatory Visit | Attending: Obstetrics and Gynecology | Admitting: Obstetrics and Gynecology

## 2021-03-24 ENCOUNTER — Ambulatory Visit (HOSPITAL_BASED_OUTPATIENT_CLINIC_OR_DEPARTMENT_OTHER): Payer: 59 | Admitting: Anesthesiology

## 2021-03-24 ENCOUNTER — Encounter (HOSPITAL_BASED_OUTPATIENT_CLINIC_OR_DEPARTMENT_OTHER): Payer: Self-pay | Admitting: Obstetrics and Gynecology

## 2021-03-24 ENCOUNTER — Other Ambulatory Visit: Payer: Self-pay

## 2021-03-24 DIAGNOSIS — R102 Pelvic and perineal pain: Secondary | ICD-10-CM | POA: Insufficient documentation

## 2021-03-24 DIAGNOSIS — N879 Dysplasia of cervix uteri, unspecified: Secondary | ICD-10-CM | POA: Diagnosis not present

## 2021-03-24 DIAGNOSIS — N946 Dysmenorrhea, unspecified: Secondary | ICD-10-CM | POA: Insufficient documentation

## 2021-03-24 DIAGNOSIS — N941 Unspecified dyspareunia: Secondary | ICD-10-CM | POA: Diagnosis not present

## 2021-03-24 DIAGNOSIS — N764 Abscess of vulva: Secondary | ICD-10-CM | POA: Diagnosis not present

## 2021-03-24 DIAGNOSIS — N840 Polyp of corpus uteri: Secondary | ICD-10-CM | POA: Diagnosis not present

## 2021-03-24 DIAGNOSIS — Z8616 Personal history of COVID-19: Secondary | ICD-10-CM | POA: Insufficient documentation

## 2021-03-24 DIAGNOSIS — F17210 Nicotine dependence, cigarettes, uncomplicated: Secondary | ICD-10-CM | POA: Diagnosis not present

## 2021-03-24 DIAGNOSIS — G8929 Other chronic pain: Secondary | ICD-10-CM | POA: Insufficient documentation

## 2021-03-24 HISTORY — PX: CYSTOSCOPY: SHX5120

## 2021-03-24 HISTORY — DX: Presence of spectacles and contact lenses: Z97.3

## 2021-03-24 HISTORY — PX: LAPAROSCOPIC HYSTERECTOMY: SHX1926

## 2021-03-24 HISTORY — PX: LAPAROSCOPIC BILATERAL SALPINGECTOMY: SHX5889

## 2021-03-24 HISTORY — DX: Presence of dental prosthetic device (complete) (partial): Z97.2

## 2021-03-24 HISTORY — DX: Unspecified osteoarthritis, unspecified site: M19.90

## 2021-03-24 HISTORY — DX: Pelvic and perineal pain: R10.2

## 2021-03-24 LAB — TYPE AND SCREEN
ABO/RH(D): O POS
Antibody Screen: NEGATIVE

## 2021-03-24 LAB — ABO/RH: ABO/RH(D): O POS

## 2021-03-24 LAB — POCT PREGNANCY, URINE: Preg Test, Ur: NEGATIVE

## 2021-03-24 SURGERY — HYSTERECTOMY, TOTAL, LAPAROSCOPIC
Anesthesia: General | Site: Bladder

## 2021-03-24 MED ORDER — SIMETHICONE 80 MG PO CHEW: 80.0000 mg | CHEWABLE_TABLET | Freq: Four times a day (QID) | ORAL | Status: DC | PRN

## 2021-03-24 MED ORDER — IBUPROFEN 200 MG PO TABS: 600.0000 mg | ORAL_TABLET | Freq: Four times a day (QID) | ORAL | Status: DC

## 2021-03-24 MED ORDER — ACETAMINOPHEN 500 MG PO TABS
ORAL_TABLET | ORAL | Status: AC
  Filled 2021-03-24: qty 2

## 2021-03-24 MED ORDER — HYDROMORPHONE HCL 1 MG/ML IJ SOLN: 1.0000 mg | INTRAMUSCULAR | Status: DC | PRN

## 2021-03-24 MED ORDER — BUPIVACAINE HCL (PF) 0.25 % IJ SOLN
INTRAMUSCULAR | Status: DC | PRN
  Administered 2021-03-24: 17 mL

## 2021-03-24 MED ORDER — PHENAZOPYRIDINE HCL 100 MG PO TABS
200.0000 mg | ORAL_TABLET | Freq: Once | ORAL | Status: AC
  Administered 2021-03-24: 200 mg via ORAL

## 2021-03-24 MED ORDER — OXYCODONE HCL 5 MG PO TABS: 5.0000 mg | ORAL_TABLET | ORAL | 0 refills | Status: DC | PRN

## 2021-03-24 MED ORDER — KETOROLAC TROMETHAMINE 30 MG/ML IJ SOLN
INTRAMUSCULAR | Status: AC
  Filled 2021-03-24: qty 1

## 2021-03-24 MED ORDER — KETOROLAC TROMETHAMINE 30 MG/ML IJ SOLN
30.0000 mg | Freq: Four times a day (QID) | INTRAMUSCULAR | Status: DC
  Administered 2021-03-24: 30 mg via INTRAVENOUS

## 2021-03-24 MED ORDER — KETOROLAC TROMETHAMINE 30 MG/ML IJ SOLN: 30.0000 mg | Freq: Once | INTRAMUSCULAR | Status: DC

## 2021-03-24 MED ORDER — AMISULPRIDE (ANTIEMETIC) 5 MG/2ML IV SOLN: 10.0000 mg | Freq: Once | INTRAVENOUS | Status: DC | PRN

## 2021-03-24 MED ORDER — SCOPOLAMINE 1 MG/3DAYS TD PT72
1.0000 | MEDICATED_PATCH | TRANSDERMAL | Status: DC
  Administered 2021-03-24: 1.5 mg via TRANSDERMAL

## 2021-03-24 MED ORDER — PROPOFOL 10 MG/ML IV BOLUS
INTRAVENOUS | Status: AC
  Filled 2021-03-24: qty 40

## 2021-03-24 MED ORDER — PHENAZOPYRIDINE HCL 100 MG PO TABS
ORAL_TABLET | ORAL | Status: AC
  Filled 2021-03-24: qty 2

## 2021-03-24 MED ORDER — MIDAZOLAM HCL 5 MG/5ML IJ SOLN
INTRAMUSCULAR | Status: DC | PRN
  Administered 2021-03-24: 2 mg via INTRAVENOUS

## 2021-03-24 MED ORDER — PROPOFOL 10 MG/ML IV BOLUS
INTRAVENOUS | Status: DC | PRN
  Administered 2021-03-24: 200 mg via INTRAVENOUS

## 2021-03-24 MED ORDER — SCOPOLAMINE 1 MG/3DAYS TD PT72
MEDICATED_PATCH | TRANSDERMAL | Status: AC
  Filled 2021-03-24: qty 1

## 2021-03-24 MED ORDER — LACTATED RINGERS IV SOLN: INTRAVENOUS | Status: DC

## 2021-03-24 MED ORDER — POVIDONE-IODINE 10 % EX SWAB
2.0000 "application " | Freq: Once | CUTANEOUS | Status: AC
  Administered 2021-03-24: 2 via TOPICAL

## 2021-03-24 MED ORDER — ONDANSETRON HCL 4 MG PO TABS: 4.0000 mg | ORAL_TABLET | Freq: Four times a day (QID) | ORAL | 0 refills | Status: DC | PRN

## 2021-03-24 MED ORDER — ONDANSETRON HCL 4 MG/2ML IJ SOLN
INTRAMUSCULAR | Status: DC | PRN
  Administered 2021-03-24: 4 mg via INTRAVENOUS

## 2021-03-24 MED ORDER — GABAPENTIN 300 MG PO CAPS
300.0000 mg | ORAL_CAPSULE | Freq: Three times a day (TID) | ORAL | Status: DC
  Administered 2021-03-24: 300 mg via ORAL

## 2021-03-24 MED ORDER — ONDANSETRON HCL 4 MG/2ML IJ SOLN
4.0000 mg | Freq: Four times a day (QID) | INTRAMUSCULAR | Status: DC | PRN
  Administered 2021-03-24: 4 mg via INTRAVENOUS

## 2021-03-24 MED ORDER — MIDAZOLAM HCL 2 MG/2ML IJ SOLN
INTRAMUSCULAR | Status: AC
  Filled 2021-03-24: qty 2

## 2021-03-24 MED ORDER — FENTANYL CITRATE (PF) 100 MCG/2ML IJ SOLN
INTRAMUSCULAR | Status: DC | PRN
  Administered 2021-03-24 (×5): 50 ug via INTRAVENOUS

## 2021-03-24 MED ORDER — SUGAMMADEX SODIUM 200 MG/2ML IV SOLN
INTRAVENOUS | Status: DC | PRN
  Administered 2021-03-24: 200 mg via INTRAVENOUS

## 2021-03-24 MED ORDER — FENTANYL CITRATE (PF) 100 MCG/2ML IJ SOLN: 25.0000 ug | INTRAMUSCULAR | Status: DC | PRN

## 2021-03-24 MED ORDER — ROCURONIUM BROMIDE 100 MG/10ML IV SOLN
INTRAVENOUS | Status: DC | PRN
  Administered 2021-03-24: 10 mg via INTRAVENOUS
  Administered 2021-03-24: 50 mg via INTRAVENOUS

## 2021-03-24 MED ORDER — CEFAZOLIN SODIUM-DEXTROSE 2-4 GM/100ML-% IV SOLN
INTRAVENOUS | Status: AC
  Filled 2021-03-24: qty 100

## 2021-03-24 MED ORDER — OXYCODONE HCL 5 MG/5ML PO SOLN: 5.0000 mg | Freq: Once | ORAL | Status: DC | PRN

## 2021-03-24 MED ORDER — KETOROLAC TROMETHAMINE 30 MG/ML IJ SOLN
INTRAMUSCULAR | Status: DC | PRN
  Administered 2021-03-24: 30 mg via INTRAVENOUS

## 2021-03-24 MED ORDER — GABAPENTIN 300 MG PO CAPS
ORAL_CAPSULE | ORAL | Status: AC
  Filled 2021-03-24: qty 1

## 2021-03-24 MED ORDER — ONDANSETRON HCL 4 MG PO TABS: 4.0000 mg | ORAL_TABLET | Freq: Four times a day (QID) | ORAL | Status: DC | PRN

## 2021-03-24 MED ORDER — ONDANSETRON HCL 4 MG/2ML IJ SOLN
INTRAMUSCULAR | Status: AC
  Filled 2021-03-24: qty 2

## 2021-03-24 MED ORDER — ACETAMINOPHEN 500 MG PO TABS
1000.0000 mg | ORAL_TABLET | Freq: Four times a day (QID) | ORAL | Status: DC
  Administered 2021-03-24: 1000 mg via ORAL

## 2021-03-24 MED ORDER — LACTATED RINGERS IV SOLN
INTRAVENOUS | Status: DC
  Administered 2021-03-24: 1000 mL via INTRAVENOUS

## 2021-03-24 MED ORDER — ONDANSETRON HCL 4 MG/2ML IJ SOLN: 4.0000 mg | Freq: Once | INTRAMUSCULAR | Status: DC | PRN

## 2021-03-24 MED ORDER — DEXTROSE-NACL 5-0.45 % IV SOLN: INTRAVENOUS | Status: DC

## 2021-03-24 MED ORDER — DEXAMETHASONE SODIUM PHOSPHATE 10 MG/ML IJ SOLN
INTRAMUSCULAR | Status: AC
  Filled 2021-03-24: qty 1

## 2021-03-24 MED ORDER — CEFAZOLIN SODIUM-DEXTROSE 2-4 GM/100ML-% IV SOLN
2.0000 g | INTRAVENOUS | Status: AC
  Administered 2021-03-24: 2 g via INTRAVENOUS

## 2021-03-24 MED ORDER — SODIUM CHLORIDE 0.9 % IR SOLN
Status: DC | PRN
  Administered 2021-03-24: 300 mL via INTRAVESICAL

## 2021-03-24 MED ORDER — MENTHOL 3 MG MT LOZG: 1.0000 | LOZENGE | OROMUCOSAL | Status: DC | PRN

## 2021-03-24 MED ORDER — LIDOCAINE 2% (20 MG/ML) 5 ML SYRINGE
INTRAMUSCULAR | Status: AC
  Filled 2021-03-24: qty 5

## 2021-03-24 MED ORDER — GABAPENTIN 300 MG PO CAPS: 300.0000 mg | ORAL_CAPSULE | Freq: Three times a day (TID) | ORAL | 0 refills | Status: DC

## 2021-03-24 MED ORDER — IBUPROFEN 600 MG PO TABS: 600.0000 mg | ORAL_TABLET | Freq: Four times a day (QID) | ORAL | 0 refills | Status: DC

## 2021-03-24 MED ORDER — DEXAMETHASONE SODIUM PHOSPHATE 4 MG/ML IJ SOLN
INTRAMUSCULAR | Status: DC | PRN
  Administered 2021-03-24: 10 mg via INTRAVENOUS

## 2021-03-24 MED ORDER — FENTANYL CITRATE (PF) 250 MCG/5ML IJ SOLN
INTRAMUSCULAR | Status: AC
  Filled 2021-03-24: qty 5

## 2021-03-24 MED ORDER — OXYCODONE HCL 5 MG PO TABS: 5.0000 mg | ORAL_TABLET | Freq: Once | ORAL | Status: DC | PRN

## 2021-03-24 MED ORDER — ALUM & MAG HYDROXIDE-SIMETH 200-200-20 MG/5ML PO SUSP: 30.0000 mL | ORAL | Status: DC | PRN

## 2021-03-24 MED ORDER — ACETAMINOPHEN 500 MG PO TABS
1000.0000 mg | ORAL_TABLET | ORAL | Status: AC
  Administered 2021-03-24: 1000 mg via ORAL

## 2021-03-24 MED ORDER — LIDOCAINE HCL (CARDIAC) PF 100 MG/5ML IV SOSY
PREFILLED_SYRINGE | INTRAVENOUS | Status: DC | PRN
  Administered 2021-03-24: 60 mg via INTRAVENOUS

## 2021-03-24 MED ORDER — BUPIVACAINE HCL 0.5 % IJ SOLN
INTRAMUSCULAR | Status: DC | PRN
  Administered 2021-03-24: 7 mL

## 2021-03-24 MED ORDER — OXYCODONE HCL 5 MG PO TABS: 5.0000 mg | ORAL_TABLET | ORAL | Status: DC | PRN

## 2021-03-24 MED ORDER — GABAPENTIN 300 MG PO CAPS
300.0000 mg | ORAL_CAPSULE | ORAL | Status: AC
  Administered 2021-03-24: 300 mg via ORAL

## 2021-03-24 MED ORDER — BISACODYL 10 MG RE SUPP: 10.0000 mg | Freq: Every day | RECTAL | Status: DC | PRN

## 2021-03-24 MED ORDER — ROCURONIUM BROMIDE 10 MG/ML (PF) SYRINGE
PREFILLED_SYRINGE | INTRAVENOUS | Status: AC
  Filled 2021-03-24: qty 10

## 2021-03-24 SURGICAL SUPPLY — 73 items
ADH SKN CLS APL DERMABOND .7 (GAUZE/BANDAGES/DRESSINGS) ×3
BAG RETRIEVAL 10 (BASKET)
BARRIER ADHS 3X4 INTERCEED (GAUZE/BANDAGES/DRESSINGS) IMPLANT
BRR ADH 4X3 ABS CNTRL BYND (GAUZE/BANDAGES/DRESSINGS)
CABLE HIGH FREQUENCY MONO STRZ (ELECTRODE) IMPLANT
CATH ROBINSON RED A/P 16FR (CATHETERS) ×3 IMPLANT
COVER BACK TABLE 60X90IN (DRAPES) IMPLANT
COVER MAYO STAND STRL (DRAPES) ×4 IMPLANT
COVER WAND RF STERILE (DRAPES) ×4 IMPLANT
DERMABOND ADVANCED (GAUZE/BANDAGES/DRESSINGS) ×1
DERMABOND ADVANCED .7 DNX12 (GAUZE/BANDAGES/DRESSINGS) ×3 IMPLANT
DEVICE SUTURE ENDOST 10MM (ENDOMECHANICALS) ×1 IMPLANT
DRSG COVADERM PLUS 2X2 (GAUZE/BANDAGES/DRESSINGS) IMPLANT
DRSG OPSITE POSTOP 3X4 (GAUZE/BANDAGES/DRESSINGS) IMPLANT
DRSG OPSITE POSTOP 4X10 (GAUZE/BANDAGES/DRESSINGS) IMPLANT
DRSG TEGADERM 2-3/8X2-3/4 SM (GAUZE/BANDAGES/DRESSINGS) ×1 IMPLANT
DRSG TELFA 3X8 NADH (GAUZE/BANDAGES/DRESSINGS) ×4 IMPLANT
DURAPREP 26ML APPLICATOR (WOUND CARE) ×4 IMPLANT
GAUZE 4X4 16PLY RFD (DISPOSABLE) ×4 IMPLANT
GLOVE SRG 8 PF TXTR STRL LF DI (GLOVE) ×3 IMPLANT
GLOVE SURG ENC MOIS LTX SZ6.5 (GLOVE) ×1 IMPLANT
GLOVE SURG ENC MOIS LTX SZ7 (GLOVE) ×1 IMPLANT
GLOVE SURG ORTHO LTX SZ7.5 (GLOVE) ×8 IMPLANT
GLOVE SURG UNDER POLY LF SZ6.5 (GLOVE) ×2 IMPLANT
GLOVE SURG UNDER POLY LF SZ7 (GLOVE) ×1 IMPLANT
GLOVE SURG UNDER POLY LF SZ7.5 (GLOVE) ×1 IMPLANT
GLOVE SURG UNDER POLY LF SZ8 (GLOVE) ×8
GOWN STRL REUS W/ TWL LRG LVL3 (GOWN DISPOSABLE) IMPLANT
GOWN STRL REUS W/TWL LRG LVL3 (GOWN DISPOSABLE) ×8 IMPLANT
GOWN STRL REUS W/TWL XL LVL3 (GOWN DISPOSABLE) ×8 IMPLANT
KIT TURNOVER CYSTO (KITS) ×4 IMPLANT
MANIFOLD NEPTUNE II (INSTRUMENTS) ×1 IMPLANT
NDL SPNL 22GX3.5 QUINCKE BK (NEEDLE) ×3 IMPLANT
NEEDLE INSUFFLATION 120MM (ENDOMECHANICALS) ×4 IMPLANT
NEEDLE SPNL 22GX3.5 QUINCKE BK (NEEDLE) ×4 IMPLANT
NS IRRIG 1000ML POUR BTL (IV SOLUTION) ×4 IMPLANT
OCCLUDER COLPOPNEUMO (BALLOONS) ×4 IMPLANT
PACK LAPAROSCOPY BASIN (CUSTOM PROCEDURE TRAY) ×4 IMPLANT
PACK TRENDGUARD 450 HYBRID PRO (MISCELLANEOUS) IMPLANT
PAD DRESSING TELFA 3X8 NADH (GAUZE/BANDAGES/DRESSINGS) IMPLANT
PROTECTOR NERVE ULNAR (MISCELLANEOUS) ×6 IMPLANT
SCISSORS LAP 5X35 DISP (ENDOMECHANICALS) IMPLANT
SET IRRIG TUBING LAPAROSCOPIC (IRRIGATION / IRRIGATOR) ×1 IMPLANT
SET IRRIG Y TYPE TUR BLADDER L (SET/KITS/TRAYS/PACK) ×1 IMPLANT
SET SUCTION IRRIG HYDROSURG (IRRIGATION / IRRIGATOR) ×3 IMPLANT
SET TRI-LUMEN FLTR TB AIRSEAL (TUBING) ×4 IMPLANT
SET TUBE SMOKE EVAC HIGH FLOW (TUBING) ×3 IMPLANT
SHEARS 1100 HARMONIC 36 (ELECTROSURGICAL) IMPLANT
SHEARS HARMONIC ACE PLUS 36CM (ENDOMECHANICALS) ×1 IMPLANT
SUT ENDO VLOC 180-0-8IN (SUTURE) ×1 IMPLANT
SUT VIC AB 0 CT1 36 (SUTURE) IMPLANT
SUT VIC AB 4-0 PS2 18 (SUTURE) ×4 IMPLANT
SUT VIC AB 4-0 PS2 27 (SUTURE) ×3 IMPLANT
SUT VICRYL 0 UR6 27IN ABS (SUTURE) ×4 IMPLANT
SYR 10ML LL (SYRINGE) ×3 IMPLANT
SYR 50ML LL SCALE MARK (SYRINGE) ×4 IMPLANT
SYR CONTROL 10ML LL (SYRINGE) ×4 IMPLANT
SYS BAG RETRIEVAL 10MM (BASKET)
SYSTEM BAG RETRIEVAL 10MM (BASKET) IMPLANT
TIP UTERINE 5.1X6CM LAV DISP (MISCELLANEOUS) IMPLANT
TIP UTERINE 6.7X10CM GRN DISP (MISCELLANEOUS) IMPLANT
TIP UTERINE 6.7X6CM WHT DISP (MISCELLANEOUS) IMPLANT
TIP UTERINE 6.7X8CM BLUE DISP (MISCELLANEOUS) ×1 IMPLANT
TOWEL OR 17X26 10 PK STRL BLUE (TOWEL DISPOSABLE) ×7 IMPLANT
TRAY FOLEY W/BAG SLVR 14FR LF (SET/KITS/TRAYS/PACK) ×4 IMPLANT
TRENDGUARD 450 HYBRID PRO PACK (MISCELLANEOUS) ×4
TROCAR BALLN 12MMX100 BLUNT (TROCAR) IMPLANT
TROCAR BLADELESS OPT 5 100 (ENDOMECHANICALS) ×5 IMPLANT
TROCAR PORT AIRSEAL 5X120 (TROCAR) ×4 IMPLANT
TROCAR XCEL BLUNT TIP 100MML (ENDOMECHANICALS) IMPLANT
TROCAR XCEL DIL TIP R 11M (ENDOMECHANICALS) ×3 IMPLANT
TROCAR XCEL NON-BLD 11X100MML (ENDOMECHANICALS) IMPLANT
WARMER LAPAROSCOPE (MISCELLANEOUS) ×4 IMPLANT

## 2021-03-24 NOTE — Anesthesia Preprocedure Evaluation (Signed)
Anesthesia Evaluation  Patient identified by MRN, date of birth, ID band Patient awake    Reviewed: Allergy & Precautions, NPO status , Patient's Chart, lab work & pertinent test results  History of Anesthesia Complications Negative for: history of anesthetic complications  Airway Mallampati: II  TM Distance: >3 FB Neck ROM: Full    Dental  (+) Upper Dentures   Pulmonary Current Smoker and Patient abstained from smoking.,    Pulmonary exam normal        Cardiovascular negative cardio ROS Normal cardiovascular exam     Neuro/Psych negative neurological ROS     GI/Hepatic negative GI ROS, Neg liver ROS,   Endo/Other  negative endocrine ROS  Renal/GU negative Renal ROS  negative genitourinary   Musculoskeletal  (+) Arthritis ,   Abdominal   Peds  Hematology negative hematology ROS (+)   Anesthesia Other Findings   Reproductive/Obstetrics Dysmenorrhea, chronic pelvic pain                             Anesthesia Physical Anesthesia Plan  ASA: II  Anesthesia Plan: General   Post-op Pain Management:    Induction: Intravenous  PONV Risk Score and Plan: 3 and Ondansetron, Dexamethasone, Treatment may vary due to age or medical condition and Midazolam  Airway Management Planned: Oral ETT  Additional Equipment: None  Intra-op Plan:   Post-operative Plan: Extubation in OR  Informed Consent: I have reviewed the patients History and Physical, chart, labs and discussed the procedure including the risks, benefits and alternatives for the proposed anesthesia with the patient or authorized representative who has indicated his/her understanding and acceptance.     Dental advisory given  Plan Discussed with:   Anesthesia Plan Comments:         Anesthesia Quick Evaluation

## 2021-03-24 NOTE — Transfer of Care (Signed)
Immediate Anesthesia Transfer of Care Note  Patient: Linda Hopkins  Procedure(s) Performed: Procedure(s) (LRB): HYSTERECTOMY TOTAL LAPAROSCOPIC; INCISION OF VULVAR LESION  (N/A) LAPAROSCOPIC BILATERAL SALPINGECTOMY (Bilateral) CYSTOSCOPY (N/A)  Patient Location: PACU  Anesthesia Type: General  Level of Consciousness: awake, sedated, patient cooperative and responds to stimulation  Airway & Oxygen Therapy: Patient Spontanous Breathing and Patient connected to [place] 02 and soft FM  Post-op Assessment: Report given to PACU RN, Post -op Vital signs reviewed and stable and Patient moving all extremities  Post vital signs: Reviewed and stable  Complications: No apparent anesthesia complications

## 2021-03-24 NOTE — Op Note (Addendum)
Preoperative diagnosis: Chronic pelvic pain, dysmenorrhea, dyspareunia, vulvar lesion Postoperative diagnosis: Same  Procedure: Total laparoscopic hysterectomy, bilateral salpingectomy, cystoscopy, incision of vulvar cyst Surgeon: Lavina Hamman M.D.  Assistant: Doristine Counter, D.O. Anesthesia: Gen. Endotracheal tube  Findings: Uterus normal size but adherent to anterior pelvis on left.  Midline omental adhesions.  Normal tubes, ovaries, appendix, gallbladder, liver, with filmy adhesions around left tube and ovary.  Assistance from Dr. Mindi Slicker was necessary for exposure and to take down pedicles on her side Specimens: Uterus and tubes for routine pathology  Estimated blood loss: 50 cc  Complications: None  Procedure in detail:  The patient was taken to the operating room and placed in the dorsosupine position. General anesthesia was induced. Arms were tucked to her sides and legs were placed in mobile stirrups. Abdomen perineum and vagina were then prepped and draped in usual sterile fashion. A 38mm RUMI with the small sized metal cup was then applied to the uterus and cervix for uterine manipulation and a Foley catheter was inserted. Infraumbilical skin was infiltrated with quarter percent Marcaine and a 1 cm vertical incision was made. A veress needle was inserted into the peritoneal cavity and placement confirmed by the water drop test an opening pressure of 3 mm of mercury. CO2 was insufflated to a pressure of 12 mm mercury and the veress needle was removed. A 5 mm trocar was then introduced with direct visualization with the laparoscope. A 5 mm airseal port was then also placed on the right side and a 10/11 trocar placed on the left side under direct visualization. Inspection revealed the above-mentioned findings. Omental adhesions were taken down with harmonic scalpel, careful to avoid any bowel.  The distal right fallopian tube was grasped with a grasper from the left side. The Harmonic scalpel Ace was  used to take down the right mesosalpinx, followed by the round ligament, utero-ovarian pedicle and broad ligament were then also taken down. The anterior peritoneum was incised across the anterior portion of the uterus to help release the bladder. Uterine artery artery was skeletonized and taken down with the harmonic scalpel Ace with adequate division and adequate hemostasis. A similar procedure was then performed on the patient's left side taking down the mesosalpinx, then the round ligament, utero-ovarian pedicle, and broad ligament. During this portion I also used the harmonic scalpel to free the thick adhesion of the left anterior uterus to the anterior pelvis.  Anterior peritoneum was incised across the anterior portion the uterus to meet the incision coming from the patient's right side, taking down some adhesions along the way. Uterine artery was skeletonized and taken down with the Harmonic Scalpel with adequate division and adequate hemostasis. I then began to detach the cervix from the vagina. The RUMI cup was pushed superiorly, the Harmonic scalpel was placed on the cup edge and a circumferential incision was made starting anteriorly into the vagina. This released the uterosacral ligaments as well as all attachments to the vagina. At this point all pedicles appeared to be hemostatic and there was no other pathology noted.  The uterus was pulled into the vagina with some difficulty due to it's size.  The pelvis was irrigated and found to be hemostatic.  The vaginal cuff was then closed with the Endostitch device using 0 V-lok suture with good closure and hemostasis.  The pelvis was again irrigated and found to be hemostatic with good closure of the vaginal cuff.  The lateral ports were removed under direct visualization and all gas  was allowed to deflate from the abdomen. The umbilical trocar was then removed. A 0 Vicryl suture was placed as deep as possible on the left side where the 10/11 port was  placed.  Skin incisions were closed with interrupted subcuticular sutures of 4-0 Vicryl followed by Dermabond.   Attention was turned to cystoscopy.  The foley catheter was removed, 70 degree cystoscope inserted, bladder filled with 200cc fluid.  Pyridium tinged urine was seen to flow freely from both ureters, bladder appeared normal.  Fluid was drained and cystoscope removed. The uterus was then removed from the vagina.  She also c/o a small bump around her clitoris.  On palpation and inspection, she had a 1 cm deep furuncle at 11:00.  This was stabilized, a stab incision was made with 11 blade, purulent material drained and the cyst resolved.  The patient tolerated the procedure well. She was taken to the recovery in stable condition. Counts were correct x2, she received Anmcef 2 gm IV at the beginning of the procedure and had PAS hose on throughout the procedure.

## 2021-03-24 NOTE — Interval H&P Note (Signed)
History and Physical Interval Note:  03/24/2021 7:14 AM  Linda [place]  has presented today for surgery, with the diagnosis of pain in pelvis, dysmenorrhea.  The various methods of treatment have been discussed with the patient and family. After consideration of risks, benefits and other options for treatment, the patient has consented to  Procedure(s): HYSTERECTOMY TOTAL LAPAROSCOPIC (N/A) LAPAROSCOPIC BILATERAL SALPINGECTOMY (Bilateral) POSSIBLE CYSTOSCOPY (N/A) as a surgical intervention.  She also has a new bum on vulva she would like for me to look at while there.  The patient's history has been reviewed, patient examined, no change in status, stable for surgery.  I have reviewed the patient's chart and labs.  Questions were answered to the patient's satisfaction.     Leighton Roach Omega Slager

## 2021-03-24 NOTE — Progress Notes (Signed)
Post-op check TLH Doing well, pain ok, no n/v, voiding, ambulating, she requests discharge Afeb, VSS Abd- soft, incisions intact Will d/c home

## 2021-03-24 NOTE — Anesthesia Procedure Notes (Signed)
Procedure Name: Intubation Date/Time: 03/24/2021 7:53 AM Performed by: Justice Rocher, CRNA Pre-anesthesia Checklist: Patient identified, Emergency Drugs available, Suction available, Patient being monitored and Timeout performed Patient Re-evaluated:Patient Re-evaluated prior to induction Oxygen Delivery Method: Circle system utilized Preoxygenation: Pre-oxygenation with 100% oxygen Induction Type: IV induction Ventilation: Mask ventilation without difficulty Laryngoscope Size: Mac and 3 Grade View: Grade II Tube type: Oral Tube size: 7.0 mm Number of attempts: 1 Airway Equipment and Method: Stylet and Oral airway Placement Confirmation: ETT inserted through vocal cords under direct vision,  positive ETCO2,  breath sounds checked- equal and bilateral and CO2 detector Secured at: 22 cm Tube secured with: Tape Dental Injury: Teeth and Oropharynx as per pre-operative assessment

## 2021-03-24 NOTE — Discharge Instructions (Signed)
Total Laparoscopic Hysterectomy, Care After The following information offers guidance on how to care for yourself after your procedure. Your health care provider may also give you more specific instructions. If you have problems or questions, contact your health care provider. What can I expect after the procedure? After the procedure, it is common to have:  Pain, bruising, and numbness around your incisions.  Tiredness (fatigue).  Poor appetite.  Less interest in sex.  Vaginal discharge or bleeding. You will need to use a sanitary pad after this procedure.  Feelings of sadness or other emotions. If your ovaries were also removed, it is also common to have symptoms of menopause, such as hot flashes, night sweats, and lack of sleep (insomnia). Follow these instructions at home: Medicines  Take over-the-counter and prescription medicines only as told by your health care provider.  Ask your health care provider if the medicine prescribed to you: ? Requires you to avoid driving or using machinery. ? Can cause constipation. You may need to take these actions to prevent or treat constipation:  Drink enough fluid to keep your urine pale yellow.  Take over-the-counter or prescription medicines.  Eat foods that are high in fiber, such as beans, whole grains, and fresh fruits and vegetables.  Limit foods that are high in fat and processed sugars, such as fried or sweet foods. Incision care  Follow instructions from your health care provider about how to take care of your incisions. Make sure you: ? Wash your hands with soap and water for at least 20 seconds before and after you change your bandage (dressing). If soap and water are not available, use hand sanitizer. ? Change your dressing as told by your health care provider. ? Leave stitches (sutures), skin glue, or adhesive strips in place. These skin closures may need to stay in place for 2 weeks or longer. If adhesive strip edges  start to loosen and curl up, you may trim the loose edges. Do not remove adhesive strips completely unless your health care provider tells you to do that.  Check your incision areas every day for signs of infection. Check for: ? More redness, swelling, or pain. ? Fluid or blood. ? Warmth. ? Pus or a bad smell.   Activity  Rest as told by your health care provider.  Avoid sitting for a long time without moving. Get up to take short walks every 1-2 hours. This is important to improve blood flow and breathing. Ask for help if you feel weak or unsteady.  Return to your normal activities as told by your health care provider. Ask your health care provider what activities are safe for you.  Do not lift anything that is heavier than 10 lb (4.5 kg), or the limit that you are told, for one month after surgery or until your health care provider says that it is safe.  If you were given a sedative during the procedure, it can affect you for several hours. Do not drive or operate machinery until your health care provider says that it is safe.   Lifestyle  Do not use any products that contain nicotine or tobacco. These products include cigarettes, chewing tobacco, and vaping devices, such as e-cigarettes. These can delay healing after surgery. If you need help quitting, ask your health care provider.  Do not drink alcohol until your health care provider approves. General instructions  Do not douche, use tampons, or have sex for at least 6 weeks, or as told by your health  care provider.  If you struggle with physical or emotional changes after your procedure, speak with your health care provider or a therapist.  Do not take baths, swim, or use a hot tub until your health care provider approves. You may only be allowed to take showers for 2-3 weeks.  Keep your dressing dry until your health care provider says it can be removed.  Try to have someone at home with you for the first 1-2 weeks to help  with your daily chores.  Wear compression stockings as told by your health care provider. These stockings help to prevent blood clots and reduce swelling in your legs.  Keep all follow-up visits. This is important.   Contact a health care provider if:  You have any of these signs of infection: ? Chills or a fever. ? More redness, swelling, or pain around an incision. ? Fluid or blood coming from an incision. ? Warmth coming from an incision. ? Pus or a bad smell coming from an incision.  An incision opens.  You feel dizzy or light-headed.  You have pain or bleeding when you urinate, or you are unable to urinate.  You have abnormal vaginal discharge.  You have pain that does not get better with medicine. Get help right away if:  You have a fever and your symptoms suddenly get worse.  You have severe abdominal pain.  You have chest pain or shortness of breath.  You faint.  You have pain, swelling, or redness in your leg.  You have heavy vaginal bleeding with blood clots, soaking through a sanitary pad in less than 1 hour. These symptoms may represent a serious problem that is an emergency. Do not wait to see if the symptoms will go away. Get medical help right away. Call your local emergency services (911 in the [place] Do not drive yourself to the hospital. Summary  After the procedure, it is common to have pain and bruising around your incisions.  Do not take baths, swim, or use a hot tub until your health care provider approves.  Do not lift anything that is heavier than 10 lb (4.5 kg), or the limit that you are told, for one month after surgery or until your health care provider says that it is safe.  Tell your health care provider if you have any signs or symptoms of infection after the procedure.  Get help right away if you have severe abdominal pain, chest pain, shortness of breath, or heavy bleeding from your vagina. This information is not intended to replace  advice given to you by your health care provider. Make sure you discuss any questions you have with your health care provider. Document Revised: 06/25/2020 Document Reviewed: 06/25/2020 Elsevier Patient Education  2021 [employer] ne instructions for laparoscopic hysterectomy   Post Anesthesia Home Care Instructions  Activity: Get plenty of rest for the remainder of the day. A responsible individual must stay with you for 24 hours following the procedure.  For the next 24 hours, DO NOT: -Drive a car -Advertising copywriter -Drink alcoholic beverages -Take any medication unless instructed by your physician -Make any legal decisions or sign important papers.  Meals: Start with liquid foods such as gelatin or soup. Progress to regular foods as tolerated. Avoid greasy, spicy, heavy foods. If nausea and/or vomiting occur, drink only clear liquids until the nausea and/or vomiting subsides. Call your physician if vomiting continues.  Special Instructions/Symptoms: Your throat may feel dry or sore from the anesthesia or  the breathing tube placed in your throat during surgery. If this causes discomfort, gargle with warm salt water. The discomfort should disappear within 24 hours.  If you had a scopolamine patch placed behind your ear for the management of post- operative nausea and/or vomiting:  1. The medication in the patch is effective for 72 hours, after which it should be removed.  Wrap patch in a tissue and discard in the trash. Wash hands thoroughly with soap and water. 2. You may remove the patch earlier than 72 hours if you experience unpleasant side effects which may include dry mouth, dizziness or visual disturbances. 3. Avoid touching the patch. Wash your hands with soap and water after contact with the patch.

## 2021-03-24 NOTE — Anesthesia Postprocedure Evaluation (Signed)
Anesthesia Post Note  Patient: Linda Hopkins  Procedure(s) Performed: HYSTERECTOMY TOTAL LAPAROSCOPIC; INCISION OF VULVAR LESION  (N/A Abdomen) LAPAROSCOPIC BILATERAL SALPINGECTOMY (Bilateral Abdomen) CYSTOSCOPY (N/A Bladder)     Patient location during evaluation: PACU Anesthesia Type: General Level of consciousness: awake and alert Pain management: pain level controlled Vital Signs Assessment: post-procedure vital signs reviewed and stable Respiratory status: spontaneous breathing, nonlabored ventilation and respiratory function stable Cardiovascular status: blood pressure returned to baseline and stable Postop Assessment: no apparent nausea or vomiting Anesthetic complications: no   No complications documented.  Last Vitals:  Vitals:   03/24/21 1120 03/24/21 1145  BP: 115/64 112/83  Pulse: 62 62  Resp: 16 17  Temp: 36.5 C (!) 36.4 C  SpO2: 100% 100%    Last Pain:  Vitals:   03/24/21 1120  TempSrc:   PainSc: 3                  Lucretia Kern

## 2021-03-25 ENCOUNTER — Encounter (HOSPITAL_BASED_OUTPATIENT_CLINIC_OR_DEPARTMENT_OTHER): Payer: Self-pay | Admitting: Obstetrics and Gynecology

## 2021-03-25 LAB — SURGICAL PATHOLOGY

## 2021-06-29 ENCOUNTER — Telehealth: Payer: 59 | Admitting: Nurse Practitioner

## 2021-06-29 DIAGNOSIS — R110 Nausea: Secondary | ICD-10-CM

## 2021-06-29 MED ORDER — ONDANSETRON HCL 4 MG PO TABS: 4.0000 mg | ORAL_TABLET | Freq: Three times a day (TID) | ORAL | 0 refills | Status: DC | PRN

## 2021-06-29 NOTE — Progress Notes (Signed)
E-Visit for Nausea and Vomiting   We are sorry that you are not feeling well. Here is how we plan to help!  Based on what you have shared with me it looks like you have a Virus that is irritating your GI tract.  Vomiting is the forceful emptying of a portion of the stomach's content through the mouth.  Although nausea and vomiting can make you feel miserable, it's important to remember that these are not diseases, but rather symptoms of an underlying illness.  When we treat short term symptoms, we always caution that any symptoms that persist should be fully evaluated in a medical office.  I have prescribed a medication that will help alleviate your symptoms and allow you to stay hydrated:  Zofran 4 mg 1 tablet every 8 hours as needed for nausea and vomiting  HOME CARE: Drink clear liquids.  This is very important! Dehydration (the lack of fluid) can lead to a serious complication.  Start off with 1 tablespoon every 5 minutes for 8 hours. You may begin eating bland foods after 8 hours without vomiting.  Start with saltine crackers, white bread, rice, mashed potatoes, applesauce. After 48 hours on a bland diet, you may resume a normal diet. Try to go to sleep.  Sleep often empties the stomach and relieves the need to vomit.  GET HELP RIGHT AWAY IF:  Your symptoms do not improve or worsen within 2 days after treatment. You have a fever for over 3 days. You cannot keep down fluids after trying the medication.  MAKE SURE YOU:  Understand these instructions. Will watch your condition. Will get help right away if you are not doing well or get worse.    Thank you for choosing an e-visit.  Your e-visit answers were reviewed by a board certified advanced clinical practitioner to complete your personal care plan. Depending upon the condition, your plan could have included both over the counter or prescription medications.  Please review your pharmacy choice. Make sure the pharmacy is open so  you can pick up prescription now. If there is a problem, you may contact your provider through MyChart messaging and have the prescription routed to another pharmacy.  Your safety is important to us. If you have drug allergies check your prescription carefully.   For the next 24 hours you can use MyChart to ask questions about today's visit, request a non-urgent call back, or ask for a work or school excuse. You will get an email in the next two days asking about your experience. I hope that your e-visit has been valuable and will speed your recovery.  5-10 minutes spent reviewing and documenting in chart.  

## 2021-07-25 ENCOUNTER — Telehealth: Payer: 59 | Admitting: Physician Assistant

## 2021-07-25 DIAGNOSIS — K649 Unspecified hemorrhoids: Secondary | ICD-10-CM

## 2021-07-25 MED ORDER — HYDROCORTISONE ACETATE 25 MG RE SUPP: 25.0000 mg | Freq: Two times a day (BID) | RECTAL | 0 refills | Status: DC

## 2021-07-25 NOTE — Progress Notes (Signed)

## 2021-08-24 NOTE — Progress Notes (Signed)
ACUTE VISIT Chief Complaint  Patient presents with   Headache    X 7 days, no history of headaches. For 4 days straight, the headache was constant. Now it comes & goes. Pt did go see her eye doctor, but her eyes checked out fine.    HPI: Ms.Kaylon Judie Petit Komar is a 40 y.o. female, who is here today complaining of 9 days of headaches as described above. Hx of headache, usually 1-2 times per months. She has not identified exacerbating or alleviating factors.  Advil used to help but has not done so this time.  Headache  This is a new problem. The current episode started in the past 7 days. The problem occurs daily. The problem has been waxing and waning. The pain quality is not similar to prior headaches. Associated symptoms include neck pain. Pertinent negatives include no abnormal behavior, anorexia, back pain, blurred vision, coughing, dizziness, drainage, ear pain, eye pain, eye redness, eye watering, fever, hearing loss, insomnia, loss of balance, muscle aches, nausea, numbness, rhinorrhea, seizures, sinus pressure, sore throat, swollen glands, visual change, vomiting, weakness or weight loss. She has tried acetaminophen for the symptoms. The treatment provided mild relief. There is no history of cancer, cluster headaches, immunosuppression, migraine headaches, migraines in the family, obesity, recent head traumas, sinus disease or TMJ.   Frontal and retro ocular pain and occasionally low occipital and temporal. "Throbbing severe pain" , today is dull. Headache is all day, max 8/10.  No associated symptoms.  She wakes up with headache and gets worse as the day goes. Sleeping 7-8 hours. No known hx of OSA. Negative for new meds or new stressors. Recent eye exam, reported as normal.  Review of Systems  Constitutional:  Positive for fatigue. Negative for activity change, appetite change, chills, fever and weight loss.  HENT:  Negative for ear pain, hearing loss, rhinorrhea, sinus  pressure and sore throat.   Eyes:  Negative for blurred vision, pain, redness and visual disturbance.  Respiratory:  Negative for cough, shortness of breath and wheezing.   Gastrointestinal:  Negative for anorexia, nausea and vomiting.  Musculoskeletal:  Positive for neck pain. Negative for back pain and gait problem.  Allergic/Immunologic: Negative for environmental allergies.  Neurological:  Positive for headaches. Negative for dizziness, seizures, weakness, numbness and loss of balance.  Psychiatric/Behavioral:  Negative for confusion. The patient does not have insomnia.   Rest see pertinent positives and negatives per HPI.  No current outpatient medications on file prior to visit.   No current facility-administered medications on file prior to visit.   Past Medical History:  Diagnosis Date   Arthritis    oa hips   COVID 09/2020   congestion body aches x 7 days all symtposm reolved   Pelvic pain    Wears dentures    upper   Wears glasses    Allergies  Allergen Reactions   Hydrocodone Nausea And Vomiting   Social History   Socioeconomic History   Marital status: Married    Spouse name: Not on file   Number of children: 2   Years of education: Not on file   Highest education level: Not on file  Occupational History   Not on file  Tobacco Use   Smoking status: Some Days    Packs/day: 0.20    Years: 15.00    Pack years: 3.00    Types: Cigarettes   Smokeless tobacco: Never  Vaping Use   Vaping Use: Never used  Substance and  Sexual Activity   Alcohol use: No   Drug use: No   Sexual activity: Yes  Other Topics Concern   Not on file  Social History Narrative   Not on file   Social Determinants of Health   Financial Resource Strain: Not on file  Food Insecurity: Not on file  Transportation Needs: Not on file  Physical Activity: Not on file  Stress: Not on file  Social Connections: Not on file   Vitals:   08/26/21 0844  BP: 110/70  Pulse: 75  Resp: 12   Temp: 98.1 F (36.7 C)  SpO2: 98%   Body mass index is 23.19 kg/m.  Physical Exam Vitals and nursing note reviewed.  Constitutional:      General: She is not in acute distress.    Appearance: She is well-developed.  HENT:     Head: Normocephalic and atraumatic.     Mouth/Throat:     Mouth: Mucous membranes are moist.     Pharynx: Oropharynx is clear.  Eyes:     Conjunctiva/sclera: Conjunctivae normal.     Funduscopic exam:    Right eye: No hemorrhage or exudate. Red reflex present.        Left eye: No hemorrhage or exudate. Red reflex present. Cardiovascular:     Rate and Rhythm: Normal rate and regular rhythm.     Heart sounds: No murmur heard. Pulmonary:     Effort: Pulmonary effort is normal. No respiratory distress.     Breath sounds: Normal breath sounds.  Abdominal:     Palpations: Abdomen is soft. There is no hepatomegaly or mass.     Tenderness: There is no abdominal tenderness.  Musculoskeletal:     Cervical back: Spasms and tenderness present. No bony tenderness. No pain with movement. Normal range of motion.       Back:  Lymphadenopathy:     Cervical: No cervical adenopathy.  Skin:    General: Skin is warm.     Findings: No erythema or rash.  Neurological:     General: No focal deficit present.     Mental Status: She is alert and oriented to person, place, and time.     Cranial Nerves: No cranial nerve deficit.     Gait: Gait normal.     Deep Tendon Reflexes:     Reflex Scores:      Bicep reflexes are 2+ on the right side and 2+ on the left side.      Patellar reflexes are 2+ on the right side and 2+ on the left side. Psychiatric:     Comments: Well groomed, good eye contact.   ASSESSMENT AND PLAN:  Ms.Elyse was seen today for headache.  Diagnoses and all orders for this visit:  Cervicalgia Mild tenderness on examination today, this could be contributing factor for headache. Problem seems to be chronic, has followed with ortho,and cervical  MRI was ordered. ROM exercises.  Flexeril 10 mg started today. Local massage and and icy hot may also help.  -     cyclobenzaprine (FLEXERIL) 10 MG tablet; Take 0.5-1 tablets (5-10 mg total) by mouth at bedtime.  Headache, unspecified headache type We discussed possible etiologies. Hx and examination today do not suggest a serious process, so I do not think head imaging is needed at this time. ? Tension headache. We discussed treatment options. She agrees with trying a muscle relaxant for 2 weeks, Flexeril 5-10 mg at bedtime, side effects discussed. If not better, we can consider Amitriptyline. Clearly  instructed about warning signs. She voices understanding and agrees with plan.  Return if symptoms worsen or fail to improve.  Trayson Stitely G. [place], MD  [facility]. [place] office.  Discharge Instructions   None

## 2021-08-26 ENCOUNTER — Ambulatory Visit (INDEPENDENT_AMBULATORY_CARE_PROVIDER_SITE_OTHER): Payer: 59 | Admitting: Family Medicine

## 2021-08-26 ENCOUNTER — Encounter: Payer: Self-pay | Admitting: Family Medicine

## 2021-08-26 ENCOUNTER — Other Ambulatory Visit: Payer: Self-pay

## 2021-08-26 VITALS — BP 110/70 | HR 75 | Temp 98.1°F | Resp 12 | Ht 64.0 in | Wt 135.1 lb

## 2021-08-26 DIAGNOSIS — M542 Cervicalgia: Secondary | ICD-10-CM | POA: Diagnosis not present

## 2021-08-26 DIAGNOSIS — R519 Headache, unspecified: Secondary | ICD-10-CM

## 2021-08-26 MED ORDER — CYCLOBENZAPRINE HCL 10 MG PO TABS: 5.0000 mg | ORAL_TABLET | Freq: Every day | ORAL | 0 refills | Status: DC

## 2021-08-26 NOTE — Patient Instructions (Signed)
A few things to remember from today's visit: = Cervicalgia - Plan: cyclobenzaprine (FLEXERIL) 10 MG tablet  Headache, unspecified headache type Lets try Flexeril at bedtime for 2 weeks. Neck ROM exercises.  If you need refills please call your pharmacy. Do not use My Chart to request refills or for acute issues that need immediate attention.  Tension Headache, Adult A tension headache is a feeling of pain, pressure, or aching in the head. It is often felt over the front and sides of the head. Tension headaches can last from 30 minutes to several days. What are the causes? The cause of this condition is not known. Sometimes, tension headaches are brought on by stress, worry (anxiety), or depression. Other things that may set them off include: Alcohol. Too much caffeine or caffeine withdrawal. Colds, flu, or sinus infections. Dental problems. This can include clenching your teeth. Being tired. Holding your head and neck in the same position for a long time, such as while using a computer. Smoking. Arthritis in the neck. What are the signs or symptoms? Feeling pressure around the head. A dull ache in the head. Pain over the front and sides of the head. Feeling sore or tender in the muscles of the head, neck, and shoulders. How is this treated? This condition may be treated with lifestyle changes and with medicines that help relieve symptoms. Follow these instructions at home: Managing pain Take over-the-counter and prescription medicines only as told by your doctor. When you have a headache, lie down in a dark, quiet room. If told, put ice on your head and neck. To do this: Put ice in a plastic bag. Place a towel between your skin and the bag. Leave the ice on for 20 minutes, 2-3 times a day. Take off the ice if your skin turns bright red. This is very important. If you cannot feel pain, heat, or cold, you have a greater risk of damage to the area. If told, put heat on the back of  your neck. Do this as often as told by your doctor. Use the heat source that your doctor recommends, such as a moist heat pack or a heating pad. Place a towel between your skin and the heat source. Leave the heat on for 20-30 minutes. Take off the heat if your skin turns bright red. This is very important. If you cannot feel pain, heat, or cold, you have a greater risk of getting burned. Eating and drinking Eat meals on a regular schedule. If you drink alcohol: Limit how much you have to: 0-1 drink a day for women who are not pregnant. 0-2 drinks a day for men. Know how much alcohol is in your drink. In the [place] one drink equals one 12 oz bottle of beer (355 mL), one 5 oz glass of wine (148 mL), or one 1 oz glass of hard liquor (44 mL). Drink enough fluid to keep your pee (urine) pale yellow. Do not use a lot of caffeine, or stop using caffeine. Lifestyle Get 7-9 hours of sleep each night. Or get the amount of sleep that your doctor tells you to. At bedtime, keep computers, phones, and tablets out of your room. Find ways to lessen your stress. This may include: Exercise. Deep breathing. Yoga. Listening to music. Thinking positive thoughts. Sit up straight. Try to relax your muscles. Do not smoke or use any products that contain nicotine or tobacco. If you need help quitting, ask your doctor. General instructions  Avoid things that can  bring on headaches. Keep a headache journal to see what may bring on headaches. For example, write down: What you eat and drink. How much sleep you get. Any change to your diet or medicines. Keep all follow-up visits. Contact a doctor if: Your headache does not get better. Your headache comes back. You have a headache, and sounds, light, or smells bother you. You feel like you may vomit, or you vomit. Your stomach hurts. Get help right away if: You all of a sudden get a very bad headache with any of these things: A stiff neck. Feeling like you  may vomit. Vomiting. Feeling mixed up (confused). Feeling weak in one part or one side of your body. Having trouble seeing or speaking, or both. Feeling short of breath. A rash. Feeling very sleepy. Pain in your eye or ear. Trouble walking or balancing. Feeling like you will faint, or you faint. Summary A tension headache is pain, pressure, or aching in your head. Tension headaches can last from 30 minutes to several days. Lifestyle changes and medicines may help relieve pain. This information is not intended to replace advice given to you by your health care provider. Make sure you discuss any questions you have with your health care provider. Document Revised: 07/22/2020 Document Reviewed: 07/22/2020 Elsevier Patient Education  2022 [employer]   Please be sure medication list is accurate. If a new problem present, please set up appointment sooner than planned today.

## 2021-08-29 ENCOUNTER — Other Ambulatory Visit: Payer: Self-pay | Admitting: Family Medicine

## 2021-08-29 ENCOUNTER — Encounter: Payer: Self-pay | Admitting: Family Medicine

## 2021-08-29 DIAGNOSIS — R519 Headache, unspecified: Secondary | ICD-10-CM

## 2021-08-29 MED ORDER — AMITRIPTYLINE HCL 25 MG PO TABS: 25.0000 mg | ORAL_TABLET | Freq: Every day | ORAL | 0 refills | Status: DC

## 2021-09-19 ENCOUNTER — Telehealth: Payer: 59 | Admitting: Physician Assistant

## 2021-09-19 DIAGNOSIS — J029 Acute pharyngitis, unspecified: Secondary | ICD-10-CM

## 2021-09-19 NOTE — Progress Notes (Signed)
  E-Visit for Sore Throat  We are sorry that you are not feeling well.  Here is how we plan to help!  Your symptoms indicate a likely viral infection (Pharyngitis).   Pharyngitis is inflammation in the back of the throat which can cause a sore throat, scratchiness and sometimes difficulty swallowing.   Pharyngitis is typically caused by a respiratory virus and will just run its course.  Please keep in mind that your symptoms could last up to 10 days.  For throat pain, we recommend over the counter oral pain relief medications such as acetaminophen or aspirin, or anti-inflammatory medications such as ibuprofen or naproxen sodium.  Topical treatments such as oral throat lozenges or sprays may be used as needed.  Avoid close contact with loved ones, especially the very young and elderly.  Remember to wash your hands thoroughly throughout the day as this is the number one way to prevent the spread of infection and wipe down door knobs and counters with disinfectant.  After careful review of your answers, I would not recommend and antibiotic for your condition.  Antibiotics should not be used to treat conditions that we suspect are caused by viruses like the virus that causes the common cold or flu. However, some people can have Strep with atypical symptoms. You may need formal testing in clinic or office to confirm if your symptoms continue or worsen.  Providers prescribe antibiotics to treat infections caused by bacteria. Antibiotics are very powerful in treating bacterial infections when they are used properly.  To maintain their effectiveness, they should be used only when necessary.  Overuse of antibiotics has resulted in the development of super bugs that are resistant to treatment!    Home Care: Only take medications as instructed by your medical team. Do not drink alcohol while taking these medications. A steam or ultrasonic humidifier can help congestion.  You can place a towel over your head and  breathe in the steam from hot water coming from a faucet. Avoid close contacts especially the very young and the elderly. Cover your mouth when you cough or sneeze. Always remember to wash your hands.  Get Help Right Away If: You develop worsening fever or throat pain. You develop a severe head ache or visual changes. Your symptoms persist after you have completed your treatment plan.  Make sure you Understand these instructions. Will watch your condition. Will get help right away if you are not doing well or get worse.   Thank you for choosing an e-visit.  Your e-visit answers were reviewed by a board certified advanced clinical practitioner to complete your personal care plan. Depending upon the condition, your plan could have included both over the counter or prescription medications.  Please review your pharmacy choice. Make sure the pharmacy is open so you can pick up prescription now. If there is a problem, you may contact your provider through MyChart messaging and have the prescription routed to another pharmacy.  Your safety is important to us. If you have drug allergies check your prescription carefully.   For the next 24 hours you can use MyChart to ask questions about today's visit, request a non-urgent call back, or ask for a work or school excuse. You will get an email in the next two days asking about your experience. I hope that your e-visit has been valuable and will speed your recovery.  I provided 5 minutes of non face-to-face time during this encounter for chart review and documentation.   

## 2021-09-20 ENCOUNTER — Telehealth: Payer: 59 | Admitting: Physician Assistant

## 2021-09-20 DIAGNOSIS — J069 Acute upper respiratory infection, unspecified: Secondary | ICD-10-CM

## 2021-09-20 MED ORDER — BENZONATATE 100 MG PO CAPS: 100.0000 mg | ORAL_CAPSULE | Freq: Three times a day (TID) | ORAL | 0 refills | Status: AC

## 2021-09-20 NOTE — Progress Notes (Signed)

## 2021-09-27 ENCOUNTER — Encounter: Payer: Self-pay | Admitting: Family Medicine

## 2021-09-27 ENCOUNTER — Other Ambulatory Visit: Payer: Self-pay | Admitting: Family Medicine

## 2021-09-27 DIAGNOSIS — R519 Headache, unspecified: Secondary | ICD-10-CM

## 2021-10-04 ENCOUNTER — Encounter: Payer: Self-pay | Admitting: Neurology

## 2021-10-06 ENCOUNTER — Ambulatory Visit: Payer: 59 | Admitting: Surgery

## 2021-10-17 ENCOUNTER — Telehealth: Payer: Self-pay | Admitting: Physician Assistant

## 2021-10-17 DIAGNOSIS — J069 Acute upper respiratory infection, unspecified: Secondary | ICD-10-CM

## 2021-10-17 MED ORDER — BENZONATATE 100 MG PO CAPS: 100.0000 mg | ORAL_CAPSULE | Freq: Three times a day (TID) | ORAL | 0 refills | Status: AC

## 2021-10-17 NOTE — Progress Notes (Signed)
E-Visit for Corona Virus Screening  Your current symptoms could be consistent with the coronavirus.  Many health care providers can now test patients at their office but not all are.  [facility] has multiple testing sites. For information on our [place] testing locations and hours go to [email or web address]  We are enrolling you in our [place] for [place] . Daily you will receive a questionnaire within the [facility] website. Our COVID 19 response team will be monitoring your responses daily.  Testing Information: The COVID-19 Community Testing sites are testing BY APPOINTMENT ONLY.  You can schedule online at [email or web address]  If you do not have access to a smart phone or computer you may call [phone] for an appointment.   Additional testing sites in the Community:  For [facility] Testing sites in   [email or web address]  For Pop-up testing sites in [place]  [email or web address]  For [facility] [email or web address]  For [facility] testing in [place] and [employer] [email or web address]  For [facility] testing in [place]   [email or web address]  For  more information about community testing call [phone]   Please quarantine yourself while awaiting your test results. Please stay home for a minimum of 10 days from the first day of illness with improving symptoms and you have had 24 hours of no fever (without the use of Tylenol (Acetaminophen) Motrin (Ibuprofen) or any fever reducing medication).  Also - Do not get tested prior to returning to work because once you have had a positive test the test can stay positive for  more than a month in some cases.   You should wear a mask or cloth face covering over your nose and mouth if you must be around other people or animals, including pets (even at home). Try to stay at least 6 feet away from other people. This will protect the people around you.  Please continue good preventive care measures, including:  frequent hand-washing, avoid touching your face, cover coughs/sneezes, stay out of crowds and keep a 6 foot distance from others.  COVID-19 is a respiratory illness with symptoms that are similar to the flu. Symptoms are typically mild to moderate, but there have been cases of severe illness and death due to the virus.   The following symptoms may appear 2-14 days after exposure: Fever Cough Shortness of breath or difficulty breathing Chills Repeated shaking with chills Muscle pain Headache Sore throat New loss of taste or smell Fatigue Congestion or runny nose Nausea or vomiting Diarrhea  Go to the nearest hospital ED for assessment if fever/cough/breathlessness are severe or illness seems like a threat to life.  It is vitally important that if you feel that you have an infection such as this virus or any other virus that you stay home and away from places where you may spread it to others.  You should avoid contact with people age 51 and older.   You can use medication such as prescription cough medication called Tessalon Perles 100 mg. You may take 1-2 capsules every 8 hours as needed for cough  You may also take acetaminophen (Tylenol) as needed for fever.  Reduce your risk of any infection by using the same precautions used for avoiding the common cold or flu:  Wash your hands often with soap and warm water for at least 20 seconds.  If soap and water are not readily available, use an alcohol-based hand sanitizer with at least 60% alcohol.  If coughing or sneezing, cover your mouth  and nose by coughing or sneezing into the elbow areas of your shirt or  coat, into a tissue or into your sleeve (not your hands). Avoid shaking hands with others and consider head nods or verbal greetings only. Avoid touching your eyes, nose, or mouth with unwashed hands.  Avoid close contact with people who are sick. Avoid places or events with large numbers of people in one location, like concerts or sporting events. Carefully consider travel plans you have or are making. If you are planning any travel outside or inside the [place], visit the CDC's Travelers' Health webpage for the latest health notices. If you have some symptoms but not all symptoms, continue to monitor at home and seek medical attention if your symptoms worsen. If you are having a medical emergency, call 911.  HOME CARE Only take medications as instructed by your medical team. Drink plenty of fluids and get plenty of rest. A steam or ultrasonic humidifier can help if you have congestion.   GET HELP RIGHT AWAY IF YOU HAVE EMERGENCY WARNING SIGNS** FOR COVID-19. If you or someone is showing any of these signs seek emergency medical care immediately. Call 911 or proceed to your closest emergency facility if: You develop worsening high fever. Trouble breathing Bluish lips or face Persistent pain or pressure in the chest New confusion Inability to wake or stay awake You cough up blood. Your symptoms become more severe  **This list is not all possible symptoms. Contact your medical provider for any symptoms that are sever or concerning to you.  MAKE SURE YOU  Understand these instructions. Will watch your condition. Will get help right away if you are not doing well or get worse.  Your e-visit answers were reviewed by a board certified advanced clinical practitioner to complete your personal care plan.  Depending on the condition, your plan could have included both over the counter or prescription medications.  If there is a problem please reply once you have received a response from your  provider.  Your safety is important to us.  If you have drug allergies check your prescription carefully.    You can use MyChart to ask questions about today's visit, request a non-urgent call back, or ask for a work or school excuse for 24 hours related to this e-Visit. If it has been greater than 24 hours you will need to follow up with your provider, or enter a new e-Visit to address those concerns. You will get an e-mail in the next two days asking about your experience.  I hope that your e-visit has been valuable and will speed your recovery. Thank you for using e-visits.  Approximately 5 minutes was spent documenting and reviewing patient's chart.   

## 2021-10-18 ENCOUNTER — Encounter: Payer: Self-pay | Admitting: Family Medicine

## 2021-11-28 ENCOUNTER — Telehealth: Payer: Self-pay | Admitting: Nurse Practitioner

## 2021-11-28 DIAGNOSIS — J029 Acute pharyngitis, unspecified: Secondary | ICD-10-CM

## 2021-11-28 NOTE — Progress Notes (Signed)
E-Visit for Sore Throat  We are sorry that you are not feeling well.  Here is how we plan to help!  Your symptoms indicate a likely viral infection (Pharyngitis).   Pharyngitis is inflammation in the back of the throat which can cause a sore throat, scratchiness and sometimes difficulty swallowing.   Pharyngitis is typically caused by a respiratory virus and will just run its course.  Please keep in mind that your symptoms could last up to 10 days.  For throat pain, we recommend over the counter oral pain relief medications such as acetaminophen or aspirin, or anti-inflammatory medications such as ibuprofen or naproxen sodium.  Topical treatments such as oral throat lozenges or sprays may be used as needed.  Avoid close contact with loved ones, especially the very young and elderly.  Remember to wash your hands thoroughly throughout the day as this is the number one way to prevent the spread of infection and wipe down door knobs and counters with disinfectant.  After careful review of your answers, I would not recommend an antibiotic for your condition.  Antibiotics should not be used to treat conditions that we suspect are caused by viruses like the virus that causes the common cold or flu. However, some people can have Strep with atypical symptoms. You may need formal testing in clinic or office to confirm if your symptoms continue or worsen.  Providers prescribe antibiotics to treat infections caused by bacteria. Antibiotics are very powerful in treating bacterial infections when they are used properly.  To maintain their effectiveness, they should be used only when necessary.  Overuse of antibiotics has resulted in the development of super bugs that are resistant to treatment!    Home Care: Only take medications as instructed by your medical team. Do not drink alcohol while taking these medications. A steam or ultrasonic humidifier can help congestion.  You can place a towel over your head and  breathe in the steam from hot water coming from a faucet. Avoid close contacts especially the very young and the elderly. Cover your mouth when you cough or sneeze. Always remember to wash your hands.  Get Help Right Away If: You develop worsening fever or throat pain. You develop a severe head ache or visual changes. Your symptoms persist after you have completed your treatment plan.  Make sure you Understand these instructions. Will watch your condition. Will get help right away if you are not doing well or get worse.   Thank you for choosing an e-visit.  Your e-visit answers were reviewed by a board certified advanced clinical practitioner to complete your personal care plan. Depending upon the condition, your plan could have included both over the counter or prescription medications.  Please review your pharmacy choice. Make sure the pharmacy is open so you can pick up prescription now. If there is a problem, you may contact your provider through MyChart messaging and have the prescription routed to another pharmacy.  Your safety is important to us. If you have drug allergies check your prescription carefully.   For the next 24 hours you can use MyChart to ask questions about today's visit, request a non-urgent call back, or ask for a work or school excuse. You will get an email in the next two days asking about your experience. I hope that your e-visit has been valuable and will speed your recovery.   I spent approximately 5 minutes reviewing the patient's history, current symptoms and coordinating their plan of care today.   

## 2021-11-29 ENCOUNTER — Ambulatory Visit: Payer: Self-pay

## 2021-12-21 ENCOUNTER — Telehealth: Payer: Self-pay | Admitting: Family

## 2021-12-21 DIAGNOSIS — M545 Low back pain, unspecified: Secondary | ICD-10-CM

## 2021-12-21 MED ORDER — BACLOFEN 10 MG PO TABS: 10.0000 mg | ORAL_TABLET | Freq: Three times a day (TID) | ORAL | 0 refills | Status: DC

## 2021-12-21 MED ORDER — NAPROXEN 500 MG PO TABS: 500.0000 mg | ORAL_TABLET | Freq: Two times a day (BID) | ORAL | 0 refills | Status: DC

## 2021-12-21 NOTE — Progress Notes (Signed)
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
## 2021-12-30 ENCOUNTER — Encounter: Payer: Self-pay | Admitting: Neurology

## 2021-12-30 ENCOUNTER — Other Ambulatory Visit: Payer: Self-pay

## 2021-12-30 ENCOUNTER — Ambulatory Visit (INDEPENDENT_AMBULATORY_CARE_PROVIDER_SITE_OTHER): Payer: 59 | Admitting: Neurology

## 2021-12-30 VITALS — BP 108/55 | HR 79 | Ht 62.0 in | Wt 134.4 lb

## 2021-12-30 DIAGNOSIS — G43709 Chronic migraine without aura, not intractable, without status migrainosus: Secondary | ICD-10-CM | POA: Diagnosis not present

## 2021-12-30 MED ORDER — RIZATRIPTAN BENZOATE 10 MG PO TBDP: 10.0000 mg | ORAL_TABLET | ORAL | 5 refills | Status: DC | PRN

## 2021-12-30 MED ORDER — TOPIRAMATE 25 MG PO TABS: 25.0000 mg | ORAL_TABLET | Freq: Every day | ORAL | 5 refills | Status: DC

## 2021-12-30 NOTE — Patient Instructions (Signed)
°  Start topiramate 25mg  at bedtime.  Contact in 4 weeks with update and we can increase dose if needed. STOP IBUPROFEN.   Take rizatriptan 10mg  at earliest onset of headache.  May repeat dose once in 2 hours if needed.  Maximum 2 tablets in 24 hours. Limit use of pain relievers to no more than 2 days out of the week.  These medications include acetaminophen, NSAIDs (ibuprofen/Advil/Motrin, naproxen/Aleve, triptans (Imitrex/sumatriptan), Excedrin, and narcotics.  This will help reduce risk of rebound headaches. Be aware of common food triggers:  - Caffeine:  coffee, black tea, cola, Mt. Dew  - Chocolate  - Dairy:  aged cheeses (brie, blue, cheddar, gouda, Rhine, provolone, Gideon, Swiss, etc), chocolate milk, buttermilk, sour cream, limit eggs and yogurt  - Nuts, peanut butter  - Alcohol  - Cereals/grains:  FRESH breads (fresh bagels, sourdough, doughnuts), yeast productions  - Processed/canned/aged/cured meats (pre-packaged deli meats, hotdogs)  - MSG/glutamate:  soy sauce, flavor enhancer, pickled/preserved/marinated foods  - Sweeteners:  aspartame (Equal, Nutrasweet).  Sugar and Splenda are okay  - Vegetables:  legumes (lima beans, lentils, snow peas, fava beans, pinto peans, peas, garbanzo beans), sauerkraut, onions, olives, pickles  - Fruit:  avocados, bananas, citrus fruit (orange, lemon, grapefruit), mango  - Other:  Frozen meals, macaroni and cheese Routine exercise Stay adequately hydrated (aim for 64 oz water daily) Keep headache diary Maintain proper stress management Maintain proper sleep hygiene Do not skip meals Consider supplements:  magnesium citrate 400mg  daily, riboflavin 400mg  daily, coenzyme Q10 100mg  three times daily.

## 2022-01-20 ENCOUNTER — Other Ambulatory Visit: Payer: 59

## 2022-02-03 ENCOUNTER — Ambulatory Visit
Admission: RE | Admit: 2022-02-03 | Discharge: 2022-02-03 | Disposition: A | Discharge status: Home / Self Care | Payer: 59 | Source: Ambulatory Visit | Attending: Neurology | Admitting: Neurology

## 2022-02-03 DIAGNOSIS — G43709 Chronic migraine without aura, not intractable, without status migrainosus: Secondary | ICD-10-CM

## 2022-02-04 ENCOUNTER — Encounter: Payer: Self-pay | Admitting: Neurology

## 2022-02-06 ENCOUNTER — Other Ambulatory Visit: Payer: Self-pay

## 2022-02-06 ENCOUNTER — Telehealth: Payer: Self-pay | Admitting: Neurology

## 2022-02-06 MED ORDER — SUMATRIPTAN SUCCINATE 100 MG PO TABS: 100.0000 mg | ORAL_TABLET | Freq: Once | ORAL | 2 refills | Status: DC | PRN

## 2022-02-06 MED ORDER — TOPIRAMATE 50 MG PO TABS: 50.0000 mg | ORAL_TABLET | Freq: Every day | ORAL | 3 refills | Status: DC

## 2022-02-06 NOTE — Progress Notes (Signed)
Per Dr.jaffe, Increase topiramate to 50mg  at bedtime.  We can increase dose in 4-6 weeks if needed.  Instead of rizatriptan, please send script for sumatriptan 100mg  - as needed.  May repeat after 2 hours.  Maximum 2 tablets in 24 hours.  ?  ?

## 2022-02-06 NOTE — Telephone Encounter (Signed)
Patient called office to get MRI results. ?

## 2022-02-06 NOTE — Progress Notes (Signed)
Patient advised of her MRI. ?Per pt the Topirmate 25 mg and Rizatriptan not helping. ?Advised pt of last office note, Can increase to 50 mg at bedtime for the Topiramate.  ? ?Please advised

## 2022-02-07 ENCOUNTER — Telehealth: Payer: Self-pay | Admitting: Neurology

## 2022-02-07 NOTE — Telephone Encounter (Signed)
Caller left message with access nurse.  Patient states RX was called into pharmacy but not covered under plan.  [facility] [address], [employer].  Topiranate and Sumatryptan.  Access nurse note and verbal orders in mailbox. ?

## 2022-02-07 NOTE — Telephone Encounter (Signed)
sPoke to patient, With the [facility] card she should be able to pick up cheaper at [facility]. ?

## 2022-02-21 ENCOUNTER — Telehealth: Payer: Self-pay | Admitting: Neurology

## 2022-02-21 MED ORDER — PREDNISONE 10 MG (21) PO TBPK: ORAL_TABLET | ORAL | 0 refills | Status: DC

## 2022-02-21 NOTE — Telephone Encounter (Signed)
Patient called and stated that she has had a migraine since Saturday.  Is there anything that she can do for this? ?

## 2022-02-21 NOTE — Telephone Encounter (Signed)
Per patient she would like to have a prednisone taper sent to the pharmacy since she is unable to make it to the clinic until Friday.  ? ?Per Dr.Aquino okay to Prednisone taper ?

## 2022-02-22 ENCOUNTER — Ambulatory Visit (INDEPENDENT_AMBULATORY_CARE_PROVIDER_SITE_OTHER): Payer: 59

## 2022-02-22 ENCOUNTER — Encounter: Payer: Self-pay | Admitting: Neurology

## 2022-02-22 DIAGNOSIS — G43709 Chronic migraine without aura, not intractable, without status migrainosus: Secondary | ICD-10-CM

## 2022-02-22 MED ORDER — TRIAMCINOLONE ACETONIDE 40 MG/ML IJ SUSP: 40.0000 mg | Freq: Once | INTRAMUSCULAR | Status: DC

## 2022-02-22 MED ORDER — KETOROLAC TROMETHAMINE 60 MG/2ML IM SOLN
60.0000 mg | Freq: Once | INTRAMUSCULAR | Status: AC
  Administered 2022-02-22: 60 mg via INTRAMUSCULAR

## 2022-02-22 MED ORDER — METOCLOPRAMIDE HCL 5 MG/ML IJ SOLN
10.0000 mg | Freq: Once | INTRAMUSCULAR | Status: AC
  Administered 2022-02-22: 10 mg via INTRAMUSCULAR

## 2022-02-22 MED ORDER — DIPHENHYDRAMINE HCL 50 MG/ML IJ SOLN
25.0000 mg | Freq: Once | INTRAMUSCULAR | Status: AC
  Administered 2022-02-22: 25 mg via INTRAMUSCULAR

## 2022-03-20 ENCOUNTER — Telehealth: Payer: Self-pay | Admitting: Neurology

## 2022-03-20 ENCOUNTER — Telehealth: Payer: 59 | Admitting: Physician Assistant

## 2022-03-20 DIAGNOSIS — K644 Residual hemorrhoidal skin tags: Secondary | ICD-10-CM

## 2022-03-20 MED ORDER — TOPIRAMATE 50 MG PO TABS: 50.0000 mg | ORAL_TABLET | Freq: Every day | ORAL | 3 refills | Status: DC

## 2022-03-20 MED ORDER — SUMATRIPTAN SUCCINATE 100 MG PO TABS: 100.0000 mg | ORAL_TABLET | Freq: Once | ORAL | 2 refills | Status: DC | PRN

## 2022-03-20 MED ORDER — HYDROCORTISONE 2.5 % EX CREA: TOPICAL_CREAM | Freq: Two times a day (BID) | CUTANEOUS | 0 refills | Status: DC

## 2022-03-20 MED ORDER — PREDNISONE 10 MG (21) PO TBPK: ORAL_TABLET | ORAL | 0 refills | Status: DC

## 2022-03-20 NOTE — Telephone Encounter (Signed)
Sumatriptan 100 mg Topiramtae 50 mg Prednisone 10 mg Pack sent to [facility] per patient request. ? ?Found [facility] coupon while patient was on the phone for cheaper cost.  ?

## 2022-03-20 NOTE — Progress Notes (Signed)
E-Visit for Hemorrhoid  We are sorry that you are not feeling well. We are here to help!  Hemorrhoids are swollen veins in the rectum. They can cause itching, bleeding, and pain. Hemorrhoids are very common.  In some cases, you can see or feel hemorrhoids around the outside of the rectum. In other cases, you cannot see them because they are hidden inside the rectum. Be patient - It can take months for this to improve or go away.   Hemorrhoids do not always cause symptoms. But when they do, symptoms can include: ?Itching of the skin around the anus ?Bleeding - Bleeding is usually painless. You might see bright red blood after using the toilet. ?Pain - If a blood clot forms inside a hemorrhoid, this can cause pain. It can also cause a lump that you might be able to feel.   What can I do to keep from getting more hemorrhoids? -- The most important thing you can do is to keep from getting constipated. You should have a bowel movement at least a few times a week. When you have a bowel movement, you also should not have to push too much. Plus, your bowel movements should not be too hard. Being constipated and having hard bowel movements can make hemorrhoids worse.   I have prescribed Topical Hydrocortisone ointment 2.5%.  Apply to area two times per day for 30 days  HOME CARE: Sitz Baths twice daily. Soak buttocks in 2 or 3 inches of warm water for 10 to 15 minutes. Do not add soap, bubble bath, or anything to the water. Stool softener such as Colace 100 mg twice daily AND Miralax 1 scoop daily until you have regular soft stools Over the counter Preparation H Tucks Pads Witch Hazel  Here are some steps you can take to avoid getting constipated or having hard stools:  ?Eat lots of fruits, vegetables, and other foods with fiber. Fiber helps to increase bowel movements. If you do not get enough fiber from your diet, you can take fiber supplements. These come in the form of powders, wafers, or  pills. Some examples are Metamucil, Citrucel, Benefiber and FiberCon. If you take a fiber supplement, be sure to read the label so you know how much to take. If you're not sure, ask your provider or nurse. ?Take medicines called "stool softeners" such as docusate sodium (sample brand names: Colace, Dulcolax). These medicines increase the number of bowel movements you have. They are safe to take and they can prevent problems later.  You should request a referral for a follow up evaluation with a Gastroenterologist (GI doctor) to evaluate this chronic and relapsing condition - even if it improves to see what further steps need to be taken. This is highly linked to chronic constipation and straining to have a bowel movement. It may require further treatment or surgical intervention.   GET HELP RIGHT AWAY IF: You develop severe pain You have heavy bleeding   FOLLOW UP WITH YOUR PRIMARY PROVIDER IF: If your symptoms do not improve within 10 days  MAKE SURE YOU  Understand these instructions. Will watch your condition. Will get help right away if you are not doing well or get worse.   Thank you for choosing an e-visit.  Your e-visit answers were reviewed by a board certified advanced clinical practitioner to complete your personal care plan. Depending upon the condition, your plan could have included both over the counter or prescription medications.  Please review your pharmacy choice. Make   sure the pharmacy is open so you can pick up prescription now. If there is a problem, you may contact your provider through MyChart messaging and have the prescription routed to another pharmacy.  Your safety is important to us. If you have drug allergies check your prescription carefully.   For the next 24 hours you can use MyChart to ask questions about today's visit, request a non-urgent call back, or ask for a work or school excuse. You will get an email in the next two days asking about your experience.  I hope that your e-visit has been valuable and will speed your recovery.  

## 2022-03-20 NOTE — Telephone Encounter (Signed)
Patient has had a migraine for 2 days. The medicine she is on is too expensive for her. One of them dont help. She needs to know what else can be done. ?

## 2022-03-20 NOTE — Progress Notes (Signed)
I have spent 5 minutes in review of e-visit questionnaire, review and updating patient chart, medical decision making and response to patient.   Nylani Michetti Cody Mj Willis, PA-C    

## 2022-04-05 ENCOUNTER — Encounter: Payer: Self-pay | Admitting: Neurology

## 2022-04-05 ENCOUNTER — Ambulatory Visit (INDEPENDENT_AMBULATORY_CARE_PROVIDER_SITE_OTHER): Payer: 59

## 2022-04-05 ENCOUNTER — Telehealth: Payer: Self-pay

## 2022-04-05 DIAGNOSIS — G43709 Chronic migraine without aura, not intractable, without status migrainosus: Secondary | ICD-10-CM | POA: Diagnosis not present

## 2022-04-05 MED ORDER — KETOROLAC TROMETHAMINE 60 MG/2ML IM SOLN
60.0000 mg | Freq: Once | INTRAMUSCULAR | Status: AC
  Administered 2022-04-05: 60 mg via INTRAMUSCULAR

## 2022-04-05 MED ORDER — METOCLOPRAMIDE HCL 5 MG/ML IJ SOLN
10.0000 mg | Freq: Once | INTRAMUSCULAR | Status: AC
  Administered 2022-04-05: 10 mg via INTRAMUSCULAR

## 2022-04-05 MED ORDER — DIPHENHYDRAMINE HCL 50 MG/ML IJ SOLN
25.0000 mg | Freq: Once | INTRAMUSCULAR | Status: AC
  Administered 2022-04-05: 25 mg via INTRAMUSCULAR

## 2022-04-05 NOTE — Telephone Encounter (Signed)
Telephone call from the patient, Per patient she had a migraine x 1 day now. " It really bad, to the point of feeling nauseas.  Ask patient if she has started her medication as discussed at her last visit and on the 15th of this month as we both researched to see if she could get it cheaper at a different pharmacy.  Per patient she could not afford that cost as well. So she hasn't started any of her medications. " I'm Flat broke right now".  Per Dr.Jaffe she may come by the office for a headache cocktail today. Patient has to have a driver.

## 2022-04-24 ENCOUNTER — Encounter (INDEPENDENT_AMBULATORY_CARE_PROVIDER_SITE_OTHER): Payer: Self-pay

## 2022-04-25 ENCOUNTER — Other Ambulatory Visit (HOSPITAL_BASED_OUTPATIENT_CLINIC_OR_DEPARTMENT_OTHER): Payer: Self-pay | Admitting: Family Medicine

## 2022-04-25 DIAGNOSIS — Z1231 Encounter for screening mammogram for malignant neoplasm of breast: Secondary | ICD-10-CM

## 2022-04-27 ENCOUNTER — Ambulatory Visit (INDEPENDENT_AMBULATORY_CARE_PROVIDER_SITE_OTHER): Payer: 59

## 2022-04-27 DIAGNOSIS — Z1231 Encounter for screening mammogram for malignant neoplasm of breast: Secondary | ICD-10-CM | POA: Diagnosis not present

## 2022-05-02 ENCOUNTER — Telehealth: Payer: Self-pay | Admitting: Neurology

## 2022-05-02 ENCOUNTER — Other Ambulatory Visit: Payer: Self-pay | Admitting: Neurology

## 2022-05-02 MED ORDER — ONDANSETRON 4 MG PO TBDP
4.0000 mg | ORAL_TABLET | Freq: Three times a day (TID) | ORAL | 5 refills | Status: DC | PRN
Start: 2022-05-02 — End: 2022-05-19

## 2022-05-02 NOTE — Telephone Encounter (Signed)
Pt called in stating her sumatriptan is making her feel nauseous about 30 minutes after taking it. She does eat and drinks water with it. She says it does work on the headaches.
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
## 2022-05-19 ENCOUNTER — Encounter: Payer: Self-pay | Admitting: Neurology

## 2022-05-19 ENCOUNTER — Ambulatory Visit (INDEPENDENT_AMBULATORY_CARE_PROVIDER_SITE_OTHER): Payer: 59 | Admitting: Neurology

## 2022-05-19 VITALS — BP 106/73 | HR 93 | Ht 63.0 in | Wt 132.0 lb

## 2022-05-19 DIAGNOSIS — G43709 Chronic migraine without aura, not intractable, without status migrainosus: Secondary | ICD-10-CM

## 2022-05-19 MED ORDER — SUMATRIPTAN 20 MG/ACT NA SOLN
20.0000 mg | NASAL | 5 refills | Status: DC | PRN
Start: 2022-05-19 — End: 2022-10-06

## 2022-05-19 NOTE — Patient Instructions (Signed)
If no improvement after 6 weeks on Topiramate, let me know and we can increase dose Stop sumatriptan pill.  Try nasal spray instead Limit use of pain relievers to no more than 2 days out of week to prevent risk of rebound or medication-overuse headache. Keep headache diary Follow up 4 months.

## 2022-05-29 ENCOUNTER — Telehealth: Payer: 59

## 2022-05-29 ENCOUNTER — Telehealth: Payer: 59 | Admitting: Nurse Practitioner

## 2022-05-29 DIAGNOSIS — R112 Nausea with vomiting, unspecified: Secondary | ICD-10-CM | POA: Diagnosis not present

## 2022-05-29 MED ORDER — ONDANSETRON HCL 4 MG PO TABS: 4.0000 mg | ORAL_TABLET | Freq: Three times a day (TID) | ORAL | 0 refills | Status: DC | PRN

## 2022-05-29 NOTE — Progress Notes (Signed)
E-Visit for Nausea and Vomiting   We are sorry that you are not feeling well. Here is how we plan to help!  Based on what you have shared with me it looks like you have a Virus that is irritating your GI tract.  Vomiting is the forceful emptying of a portion of the stomach's content through the mouth.  Although nausea and vomiting can make you feel miserable, it's important to remember that these are not diseases, but rather symptoms of an underlying illness.  When we treat short term symptoms, we always caution that any symptoms that persist should be fully evaluated in a medical office.  I have prescribed a medication that will help alleviate your symptoms and allow you to stay hydrated:  Zofran 4 mg 1 tablet every 8 hours as needed for nausea and vomiting  HOME CARE: Drink clear liquids.  This is very important! Dehydration (the lack of fluid) can lead to a serious complication.  Start off with 1 tablespoon every 5 minutes for 8 hours. You may begin eating bland foods after 8 hours without vomiting.  Start with saltine crackers, white bread, rice, mashed potatoes, applesauce. After 48 hours on a bland diet, you may resume a normal diet. Try to go to sleep.  Sleep often empties the stomach and relieves the need to vomit.  GET HELP RIGHT AWAY IF:  Your symptoms do not improve or worsen within 2 days after treatment. You have a fever for over 3 days. You cannot keep down fluids after trying the medication.  MAKE SURE YOU:  Understand these instructions. Will watch your condition. Will get help right away if you are not doing well or get worse.    Thank you for choosing an e-visit.  Your e-visit answers were reviewed by a board certified advanced clinical practitioner to complete your personal care plan. Depending upon the condition, your plan could have included both over the counter or prescription medications.  Please review your pharmacy choice. Make sure the pharmacy is open so  you can pick up prescription now. If there is a problem, you may contact your provider through MyChart messaging and have the prescription routed to another pharmacy.  Your safety is important to us. If you have drug allergies check your prescription carefully.   For the next 24 hours you can use MyChart to ask questions about today's visit, request a non-urgent call back, or ask for a work or school excuse. You will get an email in the next two days asking about your experience. I hope that your e-visit has been valuable and will speed your recovery.   I spent approximately 5 minutes reviewing the patient's history, current symptoms and coordinating their plan of care today.    Meds ordered this encounter  Medications   ondansetron (ZOFRAN) 4 MG tablet    Sig: Take 1 tablet (4 mg total) by mouth every 8 (eight) hours as needed for nausea or vomiting.    Dispense:  20 tablet    Refill:  0    

## 2022-05-31 ENCOUNTER — Telehealth: Payer: 59 | Admitting: Physician Assistant

## 2022-05-31 DIAGNOSIS — R110 Nausea: Secondary | ICD-10-CM

## 2022-05-31 DIAGNOSIS — G43809 Other migraine, not intractable, without status migrainosus: Secondary | ICD-10-CM

## 2022-05-31 NOTE — Progress Notes (Signed)
Because your symptoms have not improved since being treated on 05/29/22, I feel your condition warrants further evaluation and I recommend that you be seen in a face to face visit.   NOTE: There will be NO CHARGE for this eVisit   If you are having a true medical emergency please call 911.      For an urgent face to face visit, [place] has seven urgent care centers for your convenience:     [facility] Health Urgent Care Center at [facility] Directions [phone] [address] [place], [place] [address]    [facility] Health Urgent Care Center [facility]) Get Driving Directions [phone] [address] [place], [place] [address]  [facility]gent Care Center [facility]) Get Driving Directions [phone] [address] [place],  [place]  [address]  [facility] Health Urgent Care Center [facility] [employer] Directions  [phone] [address] [place],  [place] [address]   [facility] Health Urgent Care at [facility] Get Driving Directions [phone] [place], [place] [address]   [facility] Health Urgent Care at [facility] Get Driving Directions  [phone] [address] [place], [place] [address]   [facility] Health Urgent Care at [facility] Directions [phone] [address] [place], [place] [address]  Your MyChart E-visit questionnaire answers were reviewed by a board certified advanced clinical practitioner to complete your personal care plan based on your specific symptoms.  Thank you for using e-Visits.   I provided 5 minutes of non face-to-face time during this encounter for chart review and documentation.

## 2022-06-01 ENCOUNTER — Telehealth: Payer: 59 | Admitting: Physician Assistant

## 2022-06-01 ENCOUNTER — Ambulatory Visit
Admission: RE | Admit: 2022-06-01 | Discharge: 2022-06-01 | Disposition: A | Discharge status: Home / Self Care | Payer: 59 | Source: Ambulatory Visit | Attending: Urgent Care | Admitting: Urgent Care

## 2022-06-01 ENCOUNTER — Telehealth: Payer: Self-pay | Admitting: Neurology

## 2022-06-01 VITALS — BP 108/75 | HR 68 | Temp 97.9°F | Resp 20

## 2022-06-01 DIAGNOSIS — G43809 Other migraine, not intractable, without status migrainosus: Secondary | ICD-10-CM | POA: Diagnosis not present

## 2022-06-01 DIAGNOSIS — R202 Paresthesia of skin: Secondary | ICD-10-CM

## 2022-06-01 DIAGNOSIS — M5441 Lumbago with sciatica, right side: Secondary | ICD-10-CM

## 2022-06-01 DIAGNOSIS — G43019 Migraine without aura, intractable, without status migrainosus: Secondary | ICD-10-CM

## 2022-06-01 MED ORDER — DEXAMETHASONE SODIUM PHOSPHATE 10 MG/ML IJ SOLN
10.0000 mg | Freq: Once | INTRAMUSCULAR | Status: AC
  Administered 2022-06-01: 10 mg via INTRAMUSCULAR

## 2022-06-01 MED ORDER — METOCLOPRAMIDE HCL 5 MG/ML IJ SOLN
10.0000 mg | Freq: Once | INTRAMUSCULAR | Status: AC
  Administered 2022-06-01: 10 mg via INTRAMUSCULAR

## 2022-06-01 MED ORDER — KETOROLAC TROMETHAMINE 60 MG/2ML IM SOLN
60.0000 mg | Freq: Once | INTRAMUSCULAR | Status: AC
  Administered 2022-06-01: 60 mg via INTRAMUSCULAR

## 2022-06-01 NOTE — Progress Notes (Signed)
Because of continues migrainous symptoms for the past several days, now with new onset back pain and nerve irritation (tingling) and because you have previously been instructed to seek in-person evaluation for ongoing management, I feel your condition warrants further evaluation and I recommend that you be seen in a face to face visit.   NOTE: There will be NO CHARGE for this eVisit   If you are having a true medical emergency please call 911.      For an urgent face to face visit, [place] has seven urgent care centers for your convenience:     [facility] Health Urgent Care Center at [facility] Directions [phone] [address] [place], [place] [address]    [facility] Health Urgent Care Center [facility]) Get Driving Directions [phone] [address] [place], [place] [address]  [facility] Health Urgent Care Center [facility]) Get Driving Directions [phone] [address] [place],  [place]  [address]  [facility] Health Urgent Care Center [facility] - at [employer] Directions  [phone] [address] [place],  [place] [address]   [facility] Health Urgent Care at [facility] Get Driving Directions [phone] [address] [place], [place] [address]   [facility] Health Urgent Care at [facility] Get Driving Directions  [phone] [address] [place], [place] [address]   [facility] Health Urgent Care at [facility] Directions [phone] [address] [place], [place] [address]  Your MyChart E-visit questionnaire answers were reviewed by a board certified advanced clinical practitioner to complete your personal care plan based on your specific symptoms.  Thank you for using e-Visits.

## 2022-06-01 NOTE — ED Triage Notes (Signed)
Pt here with migraine for 2 days that has not improved with her migraine medication. Pt denies nausea, but endorses new tingling in both feet.

## 2022-06-01 NOTE — Telephone Encounter (Signed)
Patient left message with access nurse stating she has had a migraine for 2 days, and has taken topamax.  Tingling in feet, no fever, pain level 8.  She wants to come in for headache injection.

## 2022-06-01 NOTE — ED Provider Notes (Signed)
[facility] - [facility]RG[facility]ARE [facility]   MRN: [id number] DOB: [birth year]  Subjective:   Linda Hopkins is a 41 y.o. female presenting for 2-day history of persistent frontal and left sided moderate migraine headache with photophobia.  No confusion, weakness, vision changes, chest pain, shortness of breath, nausea, vomiting, abdominal pain.  Patient has a history of migraines that is longstanding.  She takes Topamax daily.  She did try sumatriptan intranasally last night without relief.  Normally has to come in and get migraine headache medicine injected to get relief.  She did have an MRI this past March and was fairly unremarkable apart from showing typical migraine findings.  She has concerned about diabetes due to tingling in both of her feet which is new with her current migraine headache.  She does have a PCP but has not had a blood sugar check with them.  No current facility-administered medications for this encounter.  Current Outpatient Medications:    ondansetron (ZOFRAN) 4 MG tablet, Take 1 tablet (4 mg total) by mouth every 8 (eight) hours as needed for nausea or vomiting., Disp: 20 tablet, Rfl: 0   SUMAtriptan (IMITREX) 20 MG/ACT nasal spray, Place 1 spray (20 mg total) into the nose as needed for migraine or headache (May repeat after 1 hour.  maximum 2 doses in 24 hours.)., Disp: 6 each, Rfl: 5   topiramate (TOPAMAX) 50 MG tablet, Take 1 tablet (50 mg total) by mouth at bedtime., Disp: 30 tablet, Rfl: 3   Allergies  Allergen Reactions   Hydrocodone Nausea And Vomiting    Past Medical History:  Diagnosis Date   Arthritis    oa hips   COVID 09/2020   congestion body aches x 7 days all symtposm reolved   Pelvic pain    Wears dentures    upper   Wears glasses      Past Surgical History:  Procedure Laterality Date   CESAREAN SECTION     x 2   CYSTOSCOPY N/A 03/24/2021   Procedure: CYSTOSCOPY;  Surgeon: Lavina Hamman, MD;  Location: [facility];   Service: Gynecology;  Laterality: N/A;   DILATION AND CURETTAGE OF UTERUS  2004   LAPAROSCOPIC BILATERAL SALPINGECTOMY Bilateral 03/24/2021   Procedure: LAPAROSCOPIC BILATERAL SALPINGECTOMY;  Surgeon: Lavina Hamman, MD;  [facility];  Service: Gynecology;  Laterality: Bilateral;   LAPAROSCOPIC HYSTERECTOMY N/A 03/24/2021   Procedure: HYSTERECTOMY TOTAL LAPAROSCOPIC; INCISION OF VULVAR LESION ;  Surgeon: Lavina Hamman, MD;  Location: [facility] [place];  Service: Gynecology;  Laterality: N/A;   TUBAL LIGATION  2011    Family History  Problem Relation Age of Onset   Cancer Mother    Seizures Maternal Grandmother    Breast cancer Maternal Grandfather    Seizures Niece     Social History   Tobacco Use   Smoking status: Some Days    Packs/day: 0.20    Years: 15.00    Total pack years: 3.00    Types: Cigarettes   Smokeless tobacco: Never  Vaping Use   Vaping Use: Never used  Substance Use Topics   Alcohol use: No   Drug use: No    ROS   Objective:   Vitals: BP 108/75   Pulse 68   Temp 97.9 F (36.6 C)   Resp 20   LMP 02/28/2021   SpO2 98%   Physical Exam Constitutional:      General: She is not in acute distress.    Appearance:  Normal appearance. She is well-developed. She is not ill-appearing, toxic-appearing or diaphoretic.  HENT:     Head: Normocephalic and atraumatic.     Nose: Nose normal.     Mouth/Throat:     Mouth: Mucous membranes are moist.  Eyes:     General: No scleral icterus.       Right eye: No discharge.        Left eye: No discharge.     Extraocular Movements: Extraocular movements intact.     Conjunctiva/sclera: Conjunctivae normal.     Pupils: Pupils are equal, round, and reactive to light.     Comments: Photophobia noted.  Neck:     Meningeal: Brudzinski's sign and Kernig's sign absent.  Cardiovascular:     Rate and Rhythm: Normal rate.  Pulmonary:     Effort: Pulmonary effort is normal.  Skin:     General: Skin is warm and dry.  Neurological:     General: No focal deficit present.     Mental Status: She is alert and oriented to person, place, and time.     Cranial Nerves: No cranial nerve deficit, dysarthria or facial asymmetry.     Motor: No weakness or pronator drift.     Coordination: Romberg sign negative. Coordination normal. Finger-Nose-Finger Test and Heel to [facility] Test normal. Rapid alternating movements normal.     Gait: Gait and tandem walk normal.     Deep Tendon Reflexes: Reflexes normal.  Psychiatric:        Mood and Affect: Mood normal.        Behavior: Behavior normal.        Thought Content: Thought content normal.        Judgment: Judgment normal.    IM Toradol, IM Reglan and IM dexamethasone in clinic.  Assessment and Plan :   PDMP not reviewed this encounter.  1. Other migraine without status migrainosus, not intractable   2. Tingling of both feet    Injectable migraine medications as above.  Recommended maintaining her regular migraine medications.  I advised that she set up a follow-up appointment with her PCP for an A1c check, general health maintenance check to look at her cholesterol, diabetes and follow-up with her regarding her migraines.  Recommended against an ER visit for now as she has a normal neurologic exam and I do not expect that she will need a head CT scan.  Counseled patient on potential for adverse effects with medications prescribed/recommended today, ER and return-to-clinic precautions discussed, patient verbalized understanding.    Wallis Bamberg, PA-C 06/01/22 1130

## 2022-06-01 NOTE — Progress Notes (Signed)
Message sent to patient requesting further input regarding current symptoms. Awaiting patient response.  

## 2022-06-01 NOTE — Telephone Encounter (Signed)
Called and left patient a message with restrictions of cocktail must have a driver and must be here by 4PM

## 2022-06-06 NOTE — Progress Notes (Signed)
Chief Complaint  Patient presents with   Follow-up    Neurologist suggested having her A1C checked due to amount of migraines; has a h/o of gestational diabetes   burp   HPI: Linda Hopkins is a 41 y.o. female, who is here today to follow on blood sugar levels. History of gestational diabetes. Negative for polydipsia,polyuria, or polyphagia.  She is following with neurologist, migraines still not well controlled, so it was suggested to be screened for diabetes.  Intermittent episodes of uncontrollable burping. Diarrhea, several episodes the last a day.  This has been going on intermittently for about a year.  Associated mild abdominal cramps, bloating sensation. It happens 1-2/month. She has not identified exacerbating alleviating factors. Negative for fever, abnormal weight loss, night sweats, blood in the stool, or melena. Problem has been stable.  B12 deficiency: She is not on B12 supplementation.  She is not start B12 injections are recommended due to insurance coverage.  Lab Results  Component Value Date   VITAMINB12 157 (L) 08/29/2019   Review of Systems  Constitutional:  Negative for activity change, appetite change and fever.  HENT:  Negative for mouth sores, nosebleeds, sore throat and trouble swallowing.   Eyes:  Negative for redness and visual disturbance.  Respiratory:  Negative for cough, shortness of breath and wheezing.   Cardiovascular:  Negative for chest pain, palpitations and leg swelling.  Gastrointestinal:  Negative for nausea and vomiting.       Negative for changes in bowel habits.  Endocrine: Negative for cold intolerance and heat intolerance.  Genitourinary:  Negative for decreased urine volume, dysuria and hematuria.  Skin:  Negative for rash.  Neurological:  Negative for syncope, weakness and headaches.  Rest see pertinent positives and negatives per HPI.  Current Outpatient Medications on File Prior to Visit  Medication Sig Dispense Refill    SUMAtriptan (IMITREX) 20 MG/ACT nasal spray Place 1 spray (20 mg total) into the nose as needed for migraine or headache (May repeat after 1 hour.  maximum 2 doses in 24 hours.). 6 each 5   topiramate (TOPAMAX) 50 MG tablet Take 1 tablet (50 mg total) by mouth at bedtime. 30 tablet 3   No current facility-administered medications on file prior to visit.   Past Medical History:  Diagnosis Date   Arthritis    oa hips   COVID 09/2020   congestion body aches x 7 days all symtposm reolved   Pelvic pain    Wears dentures    upper   Wears glasses    Allergies  Allergen Reactions   Hydrocodone Nausea And Vomiting   Social History   Socioeconomic History   Marital status: Married    Spouse name: Not on file   Number of children: 2   Years of education: Not on file   Highest education level: 12th grade  Occupational History   Not on file  Tobacco Use   Smoking status: Some Days    Packs/day: 0.20    Years: 15.00    Total pack years: 3.00    Types: Cigarettes   Smokeless tobacco: Never  Vaping Use   Vaping Use: Never used  Substance and Sexual Activity   Alcohol use: No   Drug use: No   Sexual activity: Yes  Other Topics Concern   Not on file  Social History Narrative   Right handed   Lives with husband children and mother in law   Caffeine 2 soda daily   Social Determinants of  Health   Financial Resource Strain: Low Risk  (06/05/2022)   Overall Financial Resource Strain (CARDIA)    Difficulty of Paying Living Expenses: Not hard at all  Food Insecurity: No Food Insecurity (06/05/2022)   Hunger Vital Sign    Worried About Running Out of Food in the Last Year: Never true    Ran Out of Food in the Last Year: Never true  Transportation Needs: No Transportation Needs (06/05/2022)   PRAPARE - Administrator, Civil Service (Medical): No    Lack of Transportation (Non-Medical): No  Physical Activity: Insufficiently Active (06/05/2022)   Exercise Vital Sign     Days of Exercise per Week: 2 days    Minutes of Exercise per Session: 30 min  Stress: Unknown (06/05/2022)   [employer] of Occupational Health - Occupational Stress Questionnaire    Feeling of Stress : Patient refused  Social Connections: Moderately Integrated (06/05/2022)   Social Connection and Isolation Panel [NHANES]    Frequency of Communication with Friends and Family: Twice a week    Frequency of Social Gatherings with Friends and Family: Twice a week    Attends Religious Services: 1 to 4 times per year    Active Member of [employer] or Organizations: No    Attends Banker Meetings: Not on file    Marital Status: Married   Vitals:   06/07/22 1523  BP: 122/80  Pulse: 75  Resp: 12  SpO2: 97%   Body mass index is 23.21 kg/m.  Physical Exam Vitals and nursing note reviewed.  Constitutional:      General: She is not in acute distress.    Appearance: She is well-developed.  HENT:     Head: Normocephalic and atraumatic.     Mouth/Throat:     Mouth: Mucous membranes are moist.     Pharynx: Oropharynx is clear.  Eyes:     Conjunctiva/sclera: Conjunctivae normal.  Cardiovascular:     Rate and Rhythm: Normal rate and regular rhythm.     Pulses:          Dorsalis pedis pulses are 2+ on the right side and 2+ on the left side.     Heart sounds: No murmur heard. Pulmonary:     Effort: Pulmonary effort is normal. No respiratory distress.     Breath sounds: Normal breath sounds.  Abdominal:     Palpations: Abdomen is soft. There is no hepatomegaly or mass.     Tenderness: There is no abdominal tenderness.  Lymphadenopathy:     Cervical: No cervical adenopathy.  Skin:    General: Skin is warm.     Findings: No erythema or rash.  Neurological:     General: No focal deficit present.     Mental Status: She is alert and oriented to person, place, and time.     Cranial Nerves: No cranial nerve deficit.     Gait: Gait normal.  Psychiatric:     Comments: Well  groomed, good eye contact.   ASSESSMENT AND PLAN:  Ms.Maripat was seen today for follow-up and burp.  Diagnoses and all orders for this visit: Orders Placed This Encounter  Procedures   Basic metabolic panel   CBC   TSH   Hemoglobin A1c   Vitamin B12   Lab Results  Component Value Date   VITAMINB12 160 (L) 06/07/2022   Lab Results  Component Value Date   TSH 1.59 06/07/2022   Lab Results  Component Value Date   HGBA1C  5.4 06/07/2022   Lab Results  Component Value Date   CREATININE 1.05 06/07/2022   BUN 11 06/07/2022   NA 138 06/07/2022   K 4.1 06/07/2022   CL 102 06/07/2022   CO2 26 06/07/2022   Diarrhea, unspecified type Chronic. We discussed possible etiologies. Most likely IBD-D. She agrees with trying Bentyl 10 mg tid prn during the days she has diarrhea.  -     dicyclomine (BENTYL) 10 MG capsule; Take 1 capsule (10 mg total) by mouth 3 (three) times daily before meals.  Diabetes mellitus screening -     Hemoglobin A1c  B12 deficiency She is not on B12 supplementation. Further recommendations according to B12 result.  Return if symptoms worsen or fail to improve.  Quintavius Niebuhr G. [place], MD  [facility]. [place] office.

## 2022-06-07 ENCOUNTER — Ambulatory Visit (INDEPENDENT_AMBULATORY_CARE_PROVIDER_SITE_OTHER): Payer: 59 | Admitting: Family Medicine

## 2022-06-07 ENCOUNTER — Encounter: Payer: Self-pay | Admitting: Family Medicine

## 2022-06-07 VITALS — BP 122/80 | HR 75 | Resp 12 | Ht 63.0 in | Wt 131.0 lb

## 2022-06-07 DIAGNOSIS — E538 Deficiency of other specified B group vitamins: Secondary | ICD-10-CM

## 2022-06-07 DIAGNOSIS — Z131 Encounter for screening for diabetes mellitus: Secondary | ICD-10-CM | POA: Diagnosis not present

## 2022-06-07 DIAGNOSIS — R197 Diarrhea, unspecified: Secondary | ICD-10-CM | POA: Diagnosis not present

## 2022-06-07 MED ORDER — DICYCLOMINE HCL 10 MG PO CAPS: 10.0000 mg | ORAL_CAPSULE | Freq: Three times a day (TID) | ORAL | 1 refills | Status: DC

## 2022-06-07 NOTE — Patient Instructions (Addendum)
A few things to remember from today's visit:  Diarrhea, unspecified type - Plan: Basic metabolic panel, CBC, TSH, dicyclomine (BENTYL) 10 MG capsule  Diabetes mellitus screening - Plan: Hemoglobin A1c  If you need refills please call your pharmacy. Do not use My Chart to request refills or for acute issues that need immediate attention.   You can try Bentyl 10 mg before meals those days you have diarrhea.   Please be sure medication list is accurate. If a new problem present, please set up appointment sooner than planned today.

## 2022-06-08 LAB — CBC
HCT: 43.8 % (ref 36.0–46.0)
Hemoglobin: 14.8 g/dL (ref 12.0–15.0)
MCHC: 33.7 g/dL (ref 30.0–36.0)
MCV: 94.9 fl (ref 78.0–100.0)
Platelets: 375 10*3/uL (ref 150.0–400.0)
RBC: 4.62 Mil/uL (ref 3.87–5.11)
RDW: 13.4 % (ref 11.5–15.5)
WBC: 9.6 10*3/uL (ref 4.0–10.5)

## 2022-06-08 LAB — BASIC METABOLIC PANEL
BUN: 11 mg/dL (ref 6–23)
CO2: 26 mEq/L (ref 19–32)
Calcium: 9.8 mg/dL (ref 8.4–10.5)
Chloride: 102 mEq/L (ref 96–112)
Creatinine, Ser: 1.05 mg/dL (ref 0.40–1.20)
GFR: 66.95 mL/min (ref >60.00)
Glucose, Bld: 81 mg/dL (ref 70–99)
Potassium: 4.1 mEq/L (ref 3.5–5.1)
Sodium: 138 mEq/L (ref 135–145)

## 2022-06-08 LAB — HEMOGLOBIN A1C: Hgb A1c MFr Bld: 5.4 % (ref 4.6–6.5)

## 2022-06-08 LAB — VITAMIN B12: Vitamin B-12: 160 pg/mL — ABNORMAL LOW (ref 211–911)

## 2022-06-08 LAB — TSH: TSH: 1.59 u[IU]/mL (ref 0.35–5.50)

## 2022-06-10 ENCOUNTER — Encounter: Payer: Self-pay | Admitting: Family Medicine

## 2022-06-12 ENCOUNTER — Encounter: Payer: Self-pay | Admitting: Family Medicine

## 2022-06-13 ENCOUNTER — Ambulatory Visit (INDEPENDENT_AMBULATORY_CARE_PROVIDER_SITE_OTHER): Payer: 59

## 2022-06-13 DIAGNOSIS — E538 Deficiency of other specified B group vitamins: Secondary | ICD-10-CM | POA: Diagnosis not present

## 2022-06-13 MED ORDER — CYANOCOBALAMIN 1000 MCG/ML IJ SOLN
1000.0000 ug | Freq: Once | INTRAMUSCULAR | Status: AC
  Administered 2022-06-13: 1000 ug via INTRAMUSCULAR

## 2022-06-13 NOTE — Progress Notes (Signed)
Pt here for monthly B12 injection per Dr [place].  B12 given IM, and pt tolerated injection well.  Next B12 injection scheduled for next week.

## 2022-06-16 ENCOUNTER — Ambulatory Visit: Payer: 59 | Admitting: Family Medicine

## 2022-06-20 ENCOUNTER — Ambulatory Visit (INDEPENDENT_AMBULATORY_CARE_PROVIDER_SITE_OTHER): Payer: 59 | Admitting: *Deleted

## 2022-06-20 DIAGNOSIS — E538 Deficiency of other specified B group vitamins: Secondary | ICD-10-CM | POA: Diagnosis not present

## 2022-06-20 MED ORDER — CYANOCOBALAMIN 1000 MCG/ML IJ SOLN
1000.0000 ug | Freq: Once | INTRAMUSCULAR | Status: AC
  Administered 2022-06-20: 1000 ug via INTRAMUSCULAR

## 2022-06-20 NOTE — Progress Notes (Signed)
Per orders of Cory Nafziger, injection of B12 given by Cliford Sequeira. Patient tolerated injection well. 

## 2022-06-27 ENCOUNTER — Ambulatory Visit (INDEPENDENT_AMBULATORY_CARE_PROVIDER_SITE_OTHER): Payer: 59

## 2022-06-27 DIAGNOSIS — E538 Deficiency of other specified B group vitamins: Secondary | ICD-10-CM | POA: Diagnosis not present

## 2022-06-27 MED ORDER — CYANOCOBALAMIN 1000 MCG/ML IJ SOLN
1000.0000 ug | Freq: Once | INTRAMUSCULAR | Status: AC
  Administered 2022-06-27: 1000 ug via INTRAMUSCULAR

## 2022-06-27 NOTE — Progress Notes (Signed)
Pt here for monthly B12 injection per Dr Jordan.  B12 1000mcg given IM, and pt tolerated injection well.  Next B12 injection scheduled for next week.   

## 2022-06-29 ENCOUNTER — Encounter: Payer: 59 | Admitting: Physician Assistant

## 2022-06-29 NOTE — Progress Notes (Signed)
Mother trying to do visit for her daughter but could not access daughter's MyChart to schedule appointment. Daughter added to schedule by this provider.   Erroneous encounter -- disregard.

## 2022-07-04 ENCOUNTER — Ambulatory Visit (INDEPENDENT_AMBULATORY_CARE_PROVIDER_SITE_OTHER): Payer: 59

## 2022-07-04 DIAGNOSIS — E538 Deficiency of other specified B group vitamins: Secondary | ICD-10-CM

## 2022-07-04 MED ORDER — CYANOCOBALAMIN 1000 MCG/ML IJ SOLN
1000.0000 ug | Freq: Once | INTRAMUSCULAR | Status: AC
  Administered 2022-07-04: 1000 ug via INTRAMUSCULAR

## 2022-07-04 NOTE — Progress Notes (Signed)
Pt here for monthly B12 injection per Dr Jordan.  B12 1000mcg given IM, and pt tolerated injection well.  Next B12 injection scheduled for next month.  

## 2022-07-31 ENCOUNTER — Telehealth: Payer: 59 | Admitting: Physician Assistant

## 2022-07-31 DIAGNOSIS — R112 Nausea with vomiting, unspecified: Secondary | ICD-10-CM

## 2022-07-31 MED ORDER — ONDANSETRON HCL 4 MG PO TABS: 4.0000 mg | ORAL_TABLET | Freq: Three times a day (TID) | ORAL | 0 refills | Status: DC | PRN

## 2022-07-31 NOTE — Progress Notes (Signed)

## 2022-08-04 ENCOUNTER — Ambulatory Visit: Payer: 59

## 2022-08-10 ENCOUNTER — Ambulatory Visit (INDEPENDENT_AMBULATORY_CARE_PROVIDER_SITE_OTHER): Payer: 59 | Admitting: *Deleted

## 2022-08-10 ENCOUNTER — Encounter: Payer: Self-pay | Admitting: Family Medicine

## 2022-08-10 ENCOUNTER — Telehealth: Payer: Self-pay | Admitting: Family Medicine

## 2022-08-10 DIAGNOSIS — E538 Deficiency of other specified B group vitamins: Secondary | ICD-10-CM

## 2022-08-10 MED ORDER — CYANOCOBALAMIN 1000 MCG/ML IJ SOLN
1000.0000 ug | Freq: Once | INTRAMUSCULAR | Status: AC
  Administered 2022-08-10: 1000 ug via INTRAMUSCULAR

## 2022-08-10 NOTE — Telephone Encounter (Signed)
Pt missed her last appt for a B12 shot on 08/04/22. Her niece was in the hospital. Pt called to ask if she could come in today. Pt was scheduled for this morning at 10:30am. I just realized her B-12 order was under the finalized requests tab. Is it still okay for her to come in?  Please advise.

## 2022-08-10 NOTE — Telephone Encounter (Signed)
Ok, thank you

## 2022-08-10 NOTE — Progress Notes (Signed)
Per orders of Dr. Michael, injection of B12 given by Elizabelle Fite. Patient tolerated injection well.  

## 2022-08-22 ENCOUNTER — Telehealth: Payer: 59 | Admitting: Family Medicine

## 2022-08-22 ENCOUNTER — Encounter: Payer: Self-pay | Admitting: Family Medicine

## 2022-08-22 DIAGNOSIS — J069 Acute upper respiratory infection, unspecified: Secondary | ICD-10-CM

## 2022-08-22 MED ORDER — BENZONATATE 200 MG PO CAPS: 200.0000 mg | ORAL_CAPSULE | Freq: Two times a day (BID) | ORAL | 0 refills | Status: DC | PRN

## 2022-08-22 NOTE — Progress Notes (Signed)
E-Visit for Sore Throat  We are sorry that you are not feeling well.  Here is how we plan to help!  Your symptoms indicate a likely viral infection (Pharyngitis).   Pharyngitis is inflammation in the back of the throat which can cause a sore throat, scratchiness and sometimes difficulty swallowing.   Pharyngitis is typically caused by a respiratory virus and will just run its course.  Please keep in mind that your symptoms could last up to 10 days.  For throat pain, we recommend over the counter oral pain relief medications such as acetaminophen or aspirin, or anti-inflammatory medications such as ibuprofen or naproxen sodium.  Topical treatments such as oral throat lozenges or sprays may be used as needed.  Avoid close contact with loved ones, especially the very young and elderly.  Remember to wash your hands thoroughly throughout the day as this is the number one way to prevent the spread of infection and wipe down door knobs and counters with disinfectant.   I will order Tessalon Perles for your cough. Take as directed.  After careful review of your answers, I would not recommend an antibiotic for your condition.  Antibiotics should not be used to treat conditions that we suspect are caused by viruses like the virus that causes the common cold or flu. However, some people can have Strep with atypical symptoms. You may need formal testing in clinic or office to confirm if your symptoms continue or worsen.  Providers prescribe antibiotics to treat infections caused by bacteria. Antibiotics are very powerful in treating bacterial infections when they are used properly.  To maintain their effectiveness, they should be used only when necessary.  Overuse of antibiotics has resulted in the development of super bugs that are resistant to treatment!    Home Care: Only take medications as instructed by your medical team. Do not drink alcohol while taking these medications. A steam or ultrasonic  humidifier can help congestion.  You can place a towel over your head and breathe in the steam from hot water coming from a faucet. Avoid close contacts especially the very young and the elderly. Cover your mouth when you cough or sneeze. Always remember to wash your hands.  Get Help Right Away If: You develop worsening fever or throat pain. You develop a severe head ache or visual changes. Your symptoms persist after you have completed your treatment plan.  Make sure you Understand these instructions. Will watch your condition. Will get help right away if you are not doing well or get worse.   Thank you for choosing an e-visit.  Your e-visit answers were reviewed by a board certified advanced clinical practitioner to complete your personal care plan. Depending upon the condition, your plan could have included both over the counter or prescription medications.  Please review your pharmacy choice. Make sure the pharmacy is open so you can pick up prescription now. If there is a problem, you may contact your provider through [employer] and have the prescription routed to another pharmacy.  Your safety is important to [place]. If you have drug allergies check your prescription carefully.   For the next 24 hours you can use MyChart to ask questions about today's visit, request a non-urgent call back, or ask for a work or school excuse. You will get an email in the next two days asking about your experience. I hope that your e-visit has been valuable and will speed your recovery.  I provided 5 minutes of non face-to-face  time during this encounter for chart review, medication and order placement, as well as and documentation.

## 2022-08-23 ENCOUNTER — Telehealth: Payer: Self-pay | Admitting: Family Medicine

## 2022-08-23 ENCOUNTER — Encounter: Payer: Self-pay | Admitting: Family Medicine

## 2022-08-23 ENCOUNTER — Ambulatory Visit: Payer: 59 | Admitting: Family Medicine

## 2022-08-23 VITALS — BP 98/64 | HR 99 | Temp 99.1°F | Wt 138.0 lb

## 2022-08-23 DIAGNOSIS — J020 Streptococcal pharyngitis: Secondary | ICD-10-CM

## 2022-08-23 MED ORDER — CEFUROXIME AXETIL 500 MG PO TABS: 500.0000 mg | ORAL_TABLET | Freq: Two times a day (BID) | ORAL | 0 refills | Status: AC

## 2022-08-23 NOTE — Progress Notes (Signed)
Subjective:    Patient ID: Linda Hopkins, female    DOB: 1981-01-07, 41 y.o.   MRN: [id number]  HPI Here for 3 days of a dry cough, ST, body aches, headache, and nausea without vomiting. Drinking fluids.    Review of Systems  Constitutional:  Positive for fever.  HENT:  Positive for postnasal drip and sore throat. Negative for congestion, ear pain and sinus pressure.   Eyes: Negative.   Respiratory:  Positive for cough. Negative for shortness of breath and wheezing.        Objective:   Physical Exam Constitutional:      Appearance: Normal appearance. She is not ill-appearing.  HENT:     Right Ear: Tympanic membrane, ear canal and external ear normal.     Left Ear: Tympanic membrane, ear canal and external ear normal.     Nose: Nose normal.     Mouth/Throat:     Pharynx: Oropharynx is clear.  Eyes:     Conjunctiva/sclera: Conjunctivae normal.  Pulmonary:     Effort: Pulmonary effort is normal.     Breath sounds: Normal breath sounds.  Lymphadenopathy:     Cervical: No cervical adenopathy.  Neurological:     Mental Status: She is alert.           Assessment & Plan:  Strep pharyngitis, treat with 10 days of Cefuroxime.  Gershon Crane, MD

## 2022-08-23 NOTE — Telephone Encounter (Signed)
Per appointment notes for today with Dr Sarajane Jews, patient calling back to give symptoms- Headache, sore throat, cough, nausea, constipation and diarrhea x 2 days

## 2022-08-31 NOTE — Telephone Encounter (Signed)
Pt has a visit at the office with Dr Sarajane Jews for this symptoms

## 2022-09-11 ENCOUNTER — Ambulatory Visit: Payer: 59

## 2022-09-12 ENCOUNTER — Ambulatory Visit (INDEPENDENT_AMBULATORY_CARE_PROVIDER_SITE_OTHER): Payer: 59

## 2022-09-12 DIAGNOSIS — E538 Deficiency of other specified B group vitamins: Secondary | ICD-10-CM | POA: Diagnosis not present

## 2022-09-12 MED ORDER — CYANOCOBALAMIN 1000 MCG/ML IJ SOLN
1000.0000 ug | INTRAMUSCULAR | Status: AC
  Administered 2022-09-12: 1000 ug via INTRAMUSCULAR

## 2022-09-12 NOTE — Progress Notes (Signed)
Pt here for monthly B12 injection per Dr Martinique  B12 1066mcg given IM left deltoid and pt tolerated injection well.  Next B12 injection scheduled for 10/12/22
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
## 2022-10-06 ENCOUNTER — Ambulatory Visit (INDEPENDENT_AMBULATORY_CARE_PROVIDER_SITE_OTHER): Payer: 59 | Admitting: Neurology

## 2022-10-06 ENCOUNTER — Encounter: Payer: Self-pay | Admitting: Neurology

## 2022-10-06 VITALS — BP 120/77 | HR 94 | Ht 63.0 in | Wt 134.8 lb

## 2022-10-06 DIAGNOSIS — G43009 Migraine without aura, not intractable, without status migrainosus: Secondary | ICD-10-CM | POA: Diagnosis not present

## 2022-10-06 MED ORDER — UBRELVY 100 MG PO TABS: ORAL_TABLET | ORAL | 0 refills | Status: DC

## 2022-10-06 NOTE — Patient Instructions (Signed)
Take ubrelvy as needed.  May repeat after 2 hours.  Maximum 2 in 24 hours.  Let me know if it works Follow up in 4-5 months.

## 2022-10-10 ENCOUNTER — Encounter: Payer: Self-pay | Admitting: Neurology

## 2022-10-12 ENCOUNTER — Ambulatory Visit: Payer: 59

## 2022-10-17 ENCOUNTER — Ambulatory Visit (INDEPENDENT_AMBULATORY_CARE_PROVIDER_SITE_OTHER): Payer: 59

## 2022-10-17 DIAGNOSIS — E538 Deficiency of other specified B group vitamins: Secondary | ICD-10-CM | POA: Diagnosis not present

## 2022-10-17 MED ORDER — CYANOCOBALAMIN 1000 MCG/ML IJ SOLN
1000.0000 ug | Freq: Once | INTRAMUSCULAR | Status: AC
  Administered 2022-10-17: 1000 ug via INTRAMUSCULAR

## 2022-10-17 NOTE — Progress Notes (Signed)
Pt here for monthly B12 injection per Dr. [place].  B12 given IM by Vickii Chafe on Left Deltoid. Pt tolerated injection well.

## 2022-11-13 ENCOUNTER — Telehealth: Payer: Self-pay | Admitting: Nurse Practitioner

## 2022-11-13 DIAGNOSIS — R051 Acute cough: Secondary | ICD-10-CM

## 2022-11-13 MED ORDER — BENZONATATE 100 MG PO CAPS: 100.0000 mg | ORAL_CAPSULE | Freq: Three times a day (TID) | ORAL | 0 refills | Status: DC | PRN

## 2022-11-13 NOTE — Progress Notes (Signed)
We are sorry that you are not feeling well.  Here is how we plan to help!  Based on your presentation I believe you most likely have A cough due to a virus.  This is called viral bronchitis and is best treated by rest, plenty of fluids and control of the cough.  You may use Ibuprofen or Tylenol as directed to help your symptoms.  You may want to consider a home COVID test.    In addition you may use A prescription cough medication called Tessalon Perles 100mg . You may take 1-2 capsules every 8 hours as needed for your cough.  It is likely your sore throat is from coughing. Unless you have been directly exposed to strep throat by someone in your home, typically sore throats associated with cough are viral.   From your responses in the eVisit questionnaire you describe inflammation in the upper respiratory tract which is causing a significant cough.  This is commonly called Bronchitis and has four common causes:   Allergies Viral Infections Acid Reflux Bacterial Infection Allergies, viruses and acid reflux are treated by controlling symptoms or eliminating the cause. An example might be a cough caused by taking certain blood pressure medications. You stop the cough by changing the medication. Another example might be a cough caused by acid reflux. Controlling the reflux helps control the cough.  USE OF BRONCHODILATOR ("RESCUE") INHALERS: There is a risk from using your bronchodilator too frequently.  The risk is that over-reliance on a medication which only relaxes the muscles surrounding the breathing tubes can reduce the effectiveness of medications prescribed to reduce swelling and congestion of the tubes themselves.  Although you feel brief relief from the bronchodilator inhaler, your asthma may actually be worsening with the tubes becoming more swollen and filled with mucus.  This can delay other crucial treatments, such as oral steroid medications. If you need to use a bronchodilator inhaler  daily, several times per day, you should discuss this with your provider.  There are probably better treatments that could be used to keep your asthma under control.     HOME CARE Only take medications as instructed by your medical team. Complete the entire course of an antibiotic. Drink plenty of fluids and get plenty of rest. Avoid close contacts especially the very young and the elderly Cover your mouth if you cough or cough into your sleeve. Always remember to wash your hands A steam or ultrasonic humidifier can help congestion.   GET HELP RIGHT AWAY IF: You develop worsening fever. You become short of breath You cough up blood. Your symptoms persist after you have completed your treatment plan MAKE SURE YOU  Understand these instructions. Will watch your condition. Will get help right away if you are not doing well or get worse.    Thank you for choosing an e-visit.  Your e-visit answers were reviewed by a board certified advanced clinical practitioner to complete your personal care plan. Depending upon the condition, your plan could have included both over the counter or prescription medications.  Please review your pharmacy choice. Make sure the pharmacy is open so you can pick up prescription now. If there is a problem, you may contact your provider through and have the prescription routed to another pharmacy.  Your safety is important to [employer]. If you have drug allergies check your prescription carefully.   For the next 24 hours you can use MyChart to ask questions about today's visit, request a non-urgent call  back, or ask for a work or school excuse. You will get an email in the next two days asking about your experience. I hope that your e-visit has been valuable and will speed your recovery.   Meds ordered this encounter  Medications   benzonatate (TESSALON) 100 MG capsule    Sig: Take 1 capsule (100 mg total) by mouth 3 (three) times daily as needed.     Dispense:  30 capsule    Refill:  0     I spent approximately 5 minutes reviewing the patient's history, current symptoms and coordinating their care today.

## 2022-11-14 ENCOUNTER — Ambulatory Visit (INDEPENDENT_AMBULATORY_CARE_PROVIDER_SITE_OTHER): Payer: BC Managed Care – PPO | Admitting: Family Medicine

## 2022-11-14 ENCOUNTER — Encounter: Payer: Self-pay | Admitting: Family Medicine

## 2022-11-14 VITALS — BP 90/64 | HR 72 | Temp 98.4°F | Wt 143.6 lb

## 2022-11-14 DIAGNOSIS — J020 Streptococcal pharyngitis: Secondary | ICD-10-CM

## 2022-11-14 DIAGNOSIS — J029 Acute pharyngitis, unspecified: Secondary | ICD-10-CM | POA: Diagnosis not present

## 2022-11-14 DIAGNOSIS — E538 Deficiency of other specified B group vitamins: Secondary | ICD-10-CM

## 2022-11-14 DIAGNOSIS — R52 Pain, unspecified: Secondary | ICD-10-CM | POA: Diagnosis not present

## 2022-11-14 DIAGNOSIS — R051 Acute cough: Secondary | ICD-10-CM | POA: Diagnosis not present

## 2022-11-14 LAB — POC COVID19 BINAXNOW: SARS Coronavirus 2 Ag: NEGATIVE

## 2022-11-14 LAB — POCT INFLUENZA A/B
Influenza A, POC: NEGATIVE
Influenza B, POC: NEGATIVE

## 2022-11-14 LAB — POCT RAPID STREP A (OFFICE): Rapid Strep A Screen: POSITIVE — AB

## 2022-11-14 MED ORDER — CEFUROXIME AXETIL 500 MG PO TABS: 500.0000 mg | ORAL_TABLET | Freq: Two times a day (BID) | ORAL | 0 refills | Status: AC

## 2022-11-14 NOTE — Progress Notes (Signed)
Subjective:    Patient ID: Linda Hopkins, female    DOB: 1980-12-11, 42 y.o.   MRN: [id number]  HPI Here for 2 days of headache, ST, and a dry cough. No fever.    Review of Systems  Constitutional: Negative.   HENT:  Positive for congestion and sore throat. Negative for ear pain, postnasal drip and sinus pressure.   Eyes: Negative.   Respiratory:  Positive for cough. Negative for shortness of breath and wheezing.        Objective:   Physical Exam Constitutional:      Appearance: Normal appearance. She is well-developed. She is not ill-appearing.  HENT:     Right Ear: Tympanic membrane, ear canal and external ear normal.     Left Ear: Tympanic membrane, ear canal and external ear normal.     Nose: Nose normal.     Mouth/Throat:     Pharynx: Oropharynx is clear.  Eyes:     Conjunctiva/sclera: Conjunctivae normal.  Pulmonary:     Effort: Pulmonary effort is normal.     Breath sounds: Normal breath sounds.  Lymphadenopathy:     Cervical: No cervical adenopathy.  Neurological:     Mental Status: She is alert.           Assessment & Plan:  Strep pharyngitis, treat with 10 days of Cefuroxime. Written out of work today through 11-19-22.  Gershon Crane, MD

## 2022-11-16 MED ORDER — CYANOCOBALAMIN 1000 MCG/ML IJ SOLN
1000.0000 ug | Freq: Once | INTRAMUSCULAR | Status: AC
  Administered 2022-11-14: 1000 ug via INTRAMUSCULAR

## 2022-11-16 NOTE — Addendum Note (Signed)
Addended byEncarnacion Slates on: 11/16/2022 01:47 PM   Modules accepted: Orders

## 2022-11-17 ENCOUNTER — Ambulatory Visit: Payer: 59

## 2022-12-02 DIAGNOSIS — R0789 Other chest pain: Secondary | ICD-10-CM | POA: Diagnosis not present

## 2022-12-02 DIAGNOSIS — R079 Chest pain, unspecified: Secondary | ICD-10-CM | POA: Diagnosis not present

## 2022-12-03 DIAGNOSIS — R000 Tachycardia, unspecified: Secondary | ICD-10-CM | POA: Diagnosis not present

## 2022-12-03 DIAGNOSIS — R079 Chest pain, unspecified: Secondary | ICD-10-CM | POA: Diagnosis not present

## 2022-12-15 ENCOUNTER — Ambulatory Visit: Payer: 59

## 2023-01-01 ENCOUNTER — Encounter: Payer: Self-pay | Admitting: Family Medicine

## 2023-01-01 ENCOUNTER — Telehealth: Payer: BC Managed Care – PPO | Admitting: Family Medicine

## 2023-01-01 DIAGNOSIS — K649 Unspecified hemorrhoids: Secondary | ICD-10-CM

## 2023-01-01 MED ORDER — HYDROCORTISONE ACETATE 25 MG RE SUPP: 25.0000 mg | Freq: Two times a day (BID) | RECTAL | 0 refills | Status: DC

## 2023-01-01 NOTE — Progress Notes (Signed)

## 2023-01-02 ENCOUNTER — Ambulatory Visit (INDEPENDENT_AMBULATORY_CARE_PROVIDER_SITE_OTHER): Payer: BC Managed Care – PPO

## 2023-01-02 ENCOUNTER — Encounter: Payer: Self-pay | Admitting: Neurology

## 2023-01-02 ENCOUNTER — Telehealth: Payer: Self-pay | Admitting: Anesthesiology

## 2023-01-02 DIAGNOSIS — G43009 Migraine without aura, not intractable, without status migrainosus: Secondary | ICD-10-CM

## 2023-01-02 MED ORDER — METOCLOPRAMIDE HCL 5 MG/ML IJ SOLN
10.0000 mg | Freq: Once | INTRAMUSCULAR | Status: AC
  Administered 2023-01-02: 10 mg via INTRAMUSCULAR

## 2023-01-02 MED ORDER — AJOVY 225 MG/1.5ML ~~LOC~~ SOAJ: 225.0000 mg | SUBCUTANEOUS | 5 refills | Status: DC

## 2023-01-02 MED ORDER — KETOROLAC TROMETHAMINE 60 MG/2ML IM SOLN
60.0000 mg | Freq: Once | INTRAMUSCULAR | Status: AC
  Administered 2023-01-02: 60 mg via INTRAMUSCULAR

## 2023-01-02 MED ORDER — DIPHENHYDRAMINE HCL 50 MG/ML IJ SOLN
50.0000 mg | Freq: Once | INTRAMUSCULAR | Status: AC
  Administered 2023-01-02: 25 mg via INTRAMUSCULAR

## 2023-01-02 NOTE — Telephone Encounter (Signed)
She may come in for a migraine cocktail, pending that she has a driver.  We should probably restart a preventive.  Ask her if she would like to try one of the monthly injections, Ajovy.  Will need prior authorization.  If she is agreeable, please send prescription for Ajovy injection every 30 days.  Refills 11.     Patient declined right for a preventive.   Patient advised of headache cocktail and she will need a driver.

## 2023-01-02 NOTE — Progress Notes (Signed)
Patient changed her mind she will like to go head take the offer of a preventive medication.   Per last encounter note:  DR.Jaffe, note,We should probably restart a preventive.  Ask her if she would like to try one of the monthly injections, Ajovy.  Will need prior authorization.  If she is agreeable, please send prescription for Ajovy injection every 30 days.  Refills 11.   Headache  cocktail given today. Reglan,Benadryl, and Toradol. Patient tolerated well.

## 2023-01-02 NOTE — Telephone Encounter (Signed)
Pt called stating she has had a migraine x 2 days now. Roselyn Meier is not helping get rid of it. States she would like to come in for a migraine cocktail.

## 2023-01-02 NOTE — Telephone Encounter (Signed)
Patient is calling back in stating that the pain is unbearable and is really wanting to come in sooner rather than later.

## 2023-01-04 ENCOUNTER — Telehealth: Payer: Self-pay

## 2023-01-04 NOTE — Telephone Encounter (Signed)
PA needed for ajovy per Pharmacy.

## 2023-01-16 ENCOUNTER — Other Ambulatory Visit (HOSPITAL_COMMUNITY): Payer: Self-pay

## 2023-01-16 ENCOUNTER — Telehealth: Payer: Self-pay

## 2023-01-16 NOTE — Telephone Encounter (Signed)
Patient Advocate Encounter   Received notification from [facility] that prior authorization is required for AJOVY (fremanezumab-vfrm) injection '225MG'$ /1.5ML auto-injectors  Submitted: 01-16-2023 Key [id number]  Status is pending

## 2023-01-18 NOTE — Telephone Encounter (Signed)
Patient Advocate Encounter  Prior Authorization for AJOVY (fremanezumab-vfrm) injection '225MG'$ /1.5ML auto-injector has been approved through [facility]   Effective: 01-17-2023 to 04-11-2023

## 2023-01-18 NOTE — Telephone Encounter (Signed)
LMOVm for patient PA approved.

## 2023-01-26 ENCOUNTER — Telehealth: Payer: Self-pay

## 2023-01-26 NOTE — Telephone Encounter (Signed)
FMLA Paperwork received.

## 2023-02-01 ENCOUNTER — Ambulatory Visit: Payer: 59 | Admitting: Neurology

## 2023-02-01 DIAGNOSIS — Z0279 Encounter for issue of other medical certificate: Secondary | ICD-10-CM

## 2023-02-01 NOTE — Telephone Encounter (Signed)
Pt paid form fee

## 2023-02-09 ENCOUNTER — Ambulatory Visit: Payer: 59 | Admitting: Neurology

## 2023-02-12 NOTE — Telephone Encounter (Signed)
Patient states her FMLA case was closed due to the paperwork being incomplete, asking for an update ASAP. Wants to know if it has been completed or faxed.

## 2023-02-12 NOTE — Telephone Encounter (Signed)
Advised patient dr.Jaffe not in this week but I will reach out to see if he has them.

## 2023-02-19 NOTE — Telephone Encounter (Signed)
Patient wants to know if we got her paperwork sent back in  please call

## 2023-02-20 NOTE — Telephone Encounter (Signed)
Patient is calling back about her FMLA Paperwork, she states that they should have been turned in on 02-10-23. She wants a call back

## 2023-02-20 NOTE — Telephone Encounter (Signed)
LMOVM please have job to resend paperwork and we will fax it today.

## 2023-02-22 DIAGNOSIS — G43001 Migraine without aura, not intractable, with status migrainosus: Secondary | ICD-10-CM | POA: Diagnosis not present

## 2023-02-22 DIAGNOSIS — M542 Cervicalgia: Secondary | ICD-10-CM | POA: Diagnosis not present

## 2023-02-22 DIAGNOSIS — S161XXD Strain of muscle, fascia and tendon at neck level, subsequent encounter: Secondary | ICD-10-CM | POA: Diagnosis not present

## 2023-02-22 DIAGNOSIS — M4722 Other spondylosis with radiculopathy, cervical region: Secondary | ICD-10-CM | POA: Diagnosis not present

## 2023-02-23 ENCOUNTER — Other Ambulatory Visit (INDEPENDENT_AMBULATORY_CARE_PROVIDER_SITE_OTHER): Payer: BC Managed Care – PPO

## 2023-02-23 ENCOUNTER — Ambulatory Visit (INDEPENDENT_AMBULATORY_CARE_PROVIDER_SITE_OTHER): Payer: BC Managed Care – PPO | Admitting: Orthopaedic Surgery

## 2023-02-23 DIAGNOSIS — M5412 Radiculopathy, cervical region: Secondary | ICD-10-CM

## 2023-02-23 DIAGNOSIS — G8929 Other chronic pain: Secondary | ICD-10-CM

## 2023-02-23 DIAGNOSIS — M4802 Spinal stenosis, cervical region: Secondary | ICD-10-CM | POA: Diagnosis not present

## 2023-02-23 NOTE — Progress Notes (Signed)
Office Visit Note   Patient: Linda Hopkins           Date of Birth: October 12, 1981           MRN: [id number] Visit Date: 02/23/2023              Requested by: [place], Betty G, MD [address] [place],  [place] [address] PCP: [place], Betty G, MD   Assessment & Plan: Visit Diagnoses:  1. Chronic radicular cervical pain   2. Foraminal stenosis of cervical region     Plan: Impression is chronic neck pain with underlying disc protrusion C5-6 and moderate to severe left foraminal impingement.  At this point, we have discussed referral to physical therapy versus referral to Dr. Christell Constant for further evaluation and treatment recommendation.  She would like to see Dr. Christell Constant before proceeding with any other treatment.  She will call us with concerns or questions in the meantime.  Follow-Up Instructions: Return if symptoms worsen or fail to improve.   Orders:  Orders Placed This Encounter  Procedures   XR Cervical Spine 2 or 3 views   No orders of the defined types were placed in this encounter.     Procedures: No procedures performed   Clinical Data: No additional findings.   Subjective: Chief Complaint  Patient presents with   Neck - Pain    HPI patient is a pleasant 42 year old female who comes in today with chronic neck pain and bilateral upper extremity radiculopathy.  History of disc protrusion C5-6 with moderate to severe foraminal encroachment.  Pain is worse when she is sleeping more as well as with any movement of the neck.  She denies any weakness to either upper extremity.  She does have paresthesias to both upper extremities.  She has tried multiple over-the-counter medications without any relief.  She tells me she does not do well with steroids.  She has not been to physical therapy or had epidural steroid injections.  She has been seen by Dr. Ophelia Charter as well as Zonia Kief in the past for this where surgical intervention has been recommended.  She has not been  interested in this.  Review of Systems as detailed in HPI.  All others reviewed and are negative.   Objective: Vital Signs: LMP 02/28/2021   Physical Exam well-developed well-nourished female no acute distress.  Alert and oriented x 3.  Ortho Exam cervical spine exam reveals slight left-sided paraspinous musculature tenderness.  Slight pain with range of motion of the neck.  Specialty Comments:  No specialty comments available.  Imaging: XR Cervical Spine 2 or 3 views  Result Date: 02/23/2023 Degenerative changes C5-6    PMFS History: Patient Active Problem List   Diagnosis Date Noted   Chronic pelvic pain in female 03/24/2021   Hemorrhoids 02/14/2021   COVID-19 virus infection 09/13/2020   Other spondylosis with radiculopathy, cervical region 11/25/2019   Foraminal stenosis of cervical region 11/25/2019   B12 deficiency 09/30/2019   Chronic radicular cervical pain 09/30/2019   Pelvic pain 04/04/2012   BV (bacterial vaginosis) 04/04/2012   Past Medical History:  Diagnosis Date   Arthritis    oa hips   COVID 09/2020   congestion body aches x 7 days all symtposm reolved   Pelvic pain    Wears dentures    upper   Wears glasses     Family History  Problem Relation Age of Onset   Cancer Mother    Seizures Maternal Grandmother  Breast cancer Maternal Grandfather    Seizures Niece     Past Surgical History:  Procedure Laterality Date   CESAREAN SECTION     x 2   CYSTOSCOPY N/A 03/24/2021   Procedure: CYSTOSCOPY;  Surgeon: Lavina Hamman, MD;  Location: [facility];  Service: Gynecology;  Laterality: N/A;   DILATION AND CURETTAGE OF UTERUS  2004   LAPAROSCOPIC BILATERAL SALPINGECTOMY Bilateral 03/24/2021   Procedure: LAPAROSCOPIC BILATERAL SALPINGECTOMY;  Surgeon: Lavina Hamman, MD;  Location: [facility];  Service: Gynecology;  Laterality: Bilateral;   LAPAROSCOPIC HYSTERECTOMY N/A 03/24/2021   Procedure: HYSTERECTOMY TOTAL  LAPAROSCOPIC; INCISION OF VULVAR LESION ;  Surgeon: Lavina Hamman, MD;  Location: [facility];  Service: Gynecology;  Laterality: N/A;   TUBAL LIGATION  2011   Social History   Occupational History   Not on file  Tobacco Use   Smoking status: Some Days    Packs/day: 0.20    Years: 15.00    Additional pack years: 0.00    Total pack years: 3.00    Types: Cigarettes   Smokeless tobacco: Never  Vaping Use   Vaping Use: Never used  Substance and Sexual Activity   Alcohol use: No   Drug use: No   Sexual activity: Yes

## 2023-02-26 DIAGNOSIS — N907 Vulvar cyst: Secondary | ICD-10-CM | POA: Diagnosis not present
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
## 2023-03-02 ENCOUNTER — Encounter: Payer: Self-pay | Admitting: Neurology

## 2023-03-02 ENCOUNTER — Ambulatory Visit (INDEPENDENT_AMBULATORY_CARE_PROVIDER_SITE_OTHER): Payer: BC Managed Care – PPO | Admitting: Orthopedic Surgery

## 2023-03-02 ENCOUNTER — Other Ambulatory Visit (INDEPENDENT_AMBULATORY_CARE_PROVIDER_SITE_OTHER): Payer: BC Managed Care – PPO

## 2023-03-02 ENCOUNTER — Encounter: Payer: Self-pay | Admitting: Orthopedic Surgery

## 2023-03-02 ENCOUNTER — Ambulatory Visit: Payer: BC Managed Care – PPO | Admitting: Neurology

## 2023-03-02 VITALS — BP 112/79 | HR 73 | Ht 63.0 in | Wt 143.0 lb

## 2023-03-02 VITALS — BP 101/72 | HR 82 | Ht 63.0 in | Wt 142.6 lb

## 2023-03-02 DIAGNOSIS — M4802 Spinal stenosis, cervical region: Secondary | ICD-10-CM

## 2023-03-02 DIAGNOSIS — M5412 Radiculopathy, cervical region: Secondary | ICD-10-CM | POA: Diagnosis not present

## 2023-03-02 DIAGNOSIS — G43009 Migraine without aura, not intractable, without status migrainosus: Secondary | ICD-10-CM

## 2023-03-02 DIAGNOSIS — G8929 Other chronic pain: Secondary | ICD-10-CM

## 2023-03-02 MED ORDER — EMGALITY 120 MG/ML ~~LOC~~ SOAJ: 240.0000 mg | Freq: Once | SUBCUTANEOUS | 0 refills | Status: AC

## 2023-03-02 MED ORDER — PREGABALIN 75 MG PO CAPS: 75.0000 mg | ORAL_CAPSULE | Freq: Two times a day (BID) | ORAL | 1 refills | Status: DC

## 2023-03-02 NOTE — Patient Instructions (Addendum)
Start Emgality - 2 injections for first dose.  Then 1 injection every 28 days thereafter.  After picking up loading dose, let me know (via MyChart) and I will send in prescription for standing order. When you get a migraine attack, take Nurtec (1 in 24 hours).  Let me know how it works Limit use of pain relievers (Excedrin, Tylenol, ibuprofen) to no more than 2 days out of week to prevent risk of rebound or medication-overuse headache. Keep headache diary Follow up in 6 months.
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
## 2023-03-02 NOTE — Progress Notes (Signed)
Medication Samples have been provided to the patient.  Drug name: Nurtec       Strength: 75 mg        Qty: 2  LOT: [id number]  Exp.Date: 5/26  Dosing instructions: as needed  The patient has been instructed regarding the correct time, dose, and frequency of taking this medication, including desired effects and most common side effects.   Leida Lauth 8:37 AM 03/02/2023

## 2023-03-20 ENCOUNTER — Ambulatory Visit: Payer: BC Managed Care – PPO | Admitting: Physical Therapy

## 2023-04-03 ENCOUNTER — Telehealth: Payer: Self-pay | Admitting: Neurology

## 2023-04-03 ENCOUNTER — Encounter: Payer: Self-pay | Admitting: Neurology

## 2023-04-03 NOTE — Therapy (Deleted)
OUTPATIENT PHYSICAL THERAPY CERVICAL EVALUATION   Patient Name: Linda Hopkins MRN: [id number] DOB:1981/10/03, 42 y.o., female Today's Date: 04/03/2023  END OF SESSION:   Past Medical History:  Diagnosis Date   Arthritis    oa hips   COVID 09/2020   congestion body aches x 7 days all symtposm reolved   Pelvic pain    Wears dentures    upper   Wears glasses    Past Surgical History:  Procedure Laterality Date   CESAREAN SECTION     x 2   CYSTOSCOPY N/A 03/24/2021   Procedure: CYSTOSCOPY;  Surgeon: Lavina Hamman, MD;  Location: [facility];  Service: Gynecology;  Laterality: N/A;   DILATION AND CURETTAGE OF UTERUS  2004   LAPAROSCOPIC BILATERAL SALPINGECTOMY Bilateral 03/24/2021   Procedure: LAPAROSCOPIC BILATERAL SALPINGECTOMY;  Surgeon: Lavina Hamman, MD;  Location: [facility];  Service: Gynecology;  Laterality: Bilateral;   LAPAROSCOPIC HYSTERECTOMY N/A 03/24/2021   Procedure: HYSTERECTOMY TOTAL LAPAROSCOPIC; INCISION OF VULVAR LESION ;  Surgeon: Lavina Hamman, MD;  Location: [facility];  Service: Gynecology;  Laterality: N/A;   TUBAL LIGATION  2011   Patient Active Problem List   Diagnosis Date Noted   Chronic pelvic pain in female 03/24/2021   Hemorrhoids 02/14/2021   COVID-19 virus infection 09/13/2020   Other spondylosis with radiculopathy, cervical region 11/25/2019   Foraminal stenosis of cervical region 11/25/2019   B12 deficiency 09/30/2019   Chronic radicular cervical pain 09/30/2019   Pelvic pain 04/04/2012   BV (bacterial vaginosis) 04/04/2012    PCP: Dr Betty G [place]  REFERRING PROVIDER: Dr London Sheer  REFERRING DIAG: Chronic cervical radiculopathy   THERAPY DIAG:  No diagnosis found.  Rationale for Evaluation and Treatment: Rehabilitation  ONSET DATE: ***  SUBJECTIVE:                                                                                                                                                                                                          SUBJECTIVE STATEMENT: *** Hand dominance: {MISC; OT HAND DOMINANCE:[phone]}  PERTINENT HISTORY:  ***  PAIN:  Are you having pain? Yes: NPRS scale: ***/10 Pain location: *** Pain description: *** Aggravating factors: *** Relieving factors: ***  PRECAUTIONS: None  WEIGHT BEARING RESTRICTIONS: No  FALLS:  Has patient fallen in last 6 months? No  LIVING ENVIRONMENT: Lives with: lives with their spouse Lives in: House/apartment  OCCUPATION: ***  PLOF: Independent  PATIENT GOALS: ***  NEXT MD VISIT: 04/13/23  OBJECTIVE:   DIAGNOSTIC FINDINGS:  ***  PATIENT SURVEYS:  FOTO   COGNITION: Overall cognitive status: Within functional limits for tasks assessed  SENSATION: {sensation:27233}  POSTURE: Patient presents with head forward posture with increased thoracic kyphosis; shoulders rounded and elevated; scapulae abducted and rotated along the thoracic spine; head of the humerus anterior in orientation.   PALPATION: ***   CERVICAL ROM:   Active ROM A/PROM (deg) eval  Flexion   Extension   Right lateral flexion   Left lateral flexion   Right rotation   Left rotation    (Blank rows = not tested)  UPPER EXTREMITY ROM:  Active ROM Right eval Left eval  Shoulder flexion    Shoulder extension    Shoulder abduction    Shoulder adduction    Shoulder extension    Shoulder internal rotation    Shoulder external rotation    Elbow flexion    Elbow extension    Wrist flexion    Wrist extension    Wrist ulnar deviation    Wrist radial deviation    Wrist pronation    Wrist supination     (Blank rows = not tested)  UPPER EXTREMITY MMT:  MMT Right eval Left eval  Shoulder flexion    Shoulder extension    Shoulder abduction    Shoulder adduction    Shoulder extension    Shoulder internal rotation    Shoulder external rotation    Middle trapezius    Lower trapezius     Elbow flexion    Elbow extension    Wrist flexion    Wrist extension    Wrist ulnar deviation    Wrist radial deviation    Wrist pronation    Wrist supination    Grip strength     (Blank rows = not tested)  CERVICAL SPECIAL TESTS:  {Cervical special tests:25246}  [facility] Adult PT Treatment:                                                DATE: 04/04/23 Therapeutic Exercise: *** Manual Therapy: *** Neuromuscular re-ed: *** Therapeutic Activity: *** Gait: *** Modalities: *** Self Care: ***   PATIENT EDUCATION:  Education details: POC; HEP Person educated: Patient Education method: Explanation, Demonstration, Tactile cues, Verbal cues, and Handouts Education comprehension: verbalized understanding, returned demonstration, verbal cues required, tactile cues required, and needs further education  HOME EXERCISE PROGRAM: ***  ASSESSMENT:  CLINICAL IMPRESSION: Patient is a 42 y.o. female who was seen today for physical therapy evaluation and treatment for chronic cervical radiculopathy.   OBJECTIVE IMPAIRMENTS: decreased activity tolerance, decreased ROM, decreased strength, hypomobility, increased fascial restrictions, impaired flexibility, impaired UE functional use, improper body mechanics, postural dysfunction, and pain.   ACTIVITY LIMITATIONS: carrying, lifting, sleeping, and reach over head  PARTICIPATION LIMITATIONS: meal prep, cleaning, laundry, driving, shopping, and occupation  PERSONAL FACTORS: Past/current experiences, Profession, and Time since onset of injury/illness/exacerbation are also affecting patient's functional outcome.   REHAB POTENTIAL: Good  CLINICAL DECISION MAKING: Stable/uncomplicated  EVALUATION COMPLEXITY: Low   GOALS: Goals reviewed with patient? Yes  SHORT TERM GOALS: Target date: ***  Independent in initial HEP  Baseline:  Goal status: INITIAL  2.  Patient demonstrates and/or verbalizes proper body mechanics for sitting and  desk work  Baseline:  Goal status: INITIAL   LONG TERM GOALS: Target date: ***  *** Baseline:  Goal status: INITIAL  2.  *** Baseline:  Goal status: INITIAL  3.  *** Baseline:  Goal status: INITIAL  4.  *** Baseline:  Goal status: INITIAL  5.  *** Baseline:  Goal status: INITIAL  6.  *** Baseline:  Goal status: INITIAL   PLAN:  PT FREQUENCY: 2x/week  PT DURATION: 12 weeks  PLANNED INTERVENTIONS: Therapeutic exercises, Therapeutic activity, Neuromuscular re-education, Patient/Family education, Self Care, Joint mobilization, Dry Needling, Electrical stimulation, Spinal mobilization, Cryotherapy, Moist heat, Taping, Traction, Ultrasound, Ionotophoresis 4mg /ml Dexamethasone, Manual therapy, and Re-evaluation  PLAN FOR NEXT SESSION: review and progress exercises; continue with spine care, postural correction, education; manual work, Dn, modalities as indicate    [employer], PT 04/03/2023, 8:47 AM

## 2023-04-03 NOTE — Telephone Encounter (Signed)
Per Dr.Jaffe,  Letter is completed  LMOVM.

## 2023-04-03 NOTE — Telephone Encounter (Signed)
Patient states that she needs a letter that states the fumes in her department at work can trigger her Migraine

## 2023-04-04 ENCOUNTER — Ambulatory Visit: Payer: BC Managed Care – PPO | Admitting: Rehabilitative and Restorative Service Providers"

## 2023-04-13 ENCOUNTER — Ambulatory Visit: Payer: BC Managed Care – PPO | Admitting: Orthopedic Surgery

## 2023-05-15 ENCOUNTER — Encounter: Payer: Self-pay | Admitting: Family Medicine

## 2023-05-15 ENCOUNTER — Ambulatory Visit: Payer: BC Managed Care – PPO | Admitting: Family Medicine

## 2023-05-15 VITALS — BP 114/70 | HR 100 | Temp 97.9°F | Ht 63.0 in | Wt 142.6 lb

## 2023-05-15 DIAGNOSIS — R600 Localized edema: Secondary | ICD-10-CM | POA: Diagnosis not present

## 2023-05-15 NOTE — Progress Notes (Signed)
Established Patient Office Visit  Subjective   Patient ID: Linda Hopkins, female    DOB: 03/01/1981  Age: 42 y.o. MRN: [id number]  Chief Complaint  Patient presents with   Edema    Patient complains of Bilateral leg edema, x2 days     HPI   Linda Hopkins is seen today with onset couple days ago of feet and lower leg swelling.  She states her symptoms are much improved today.  She had Lyrica listed on her drug list but never took this.  She does not take any regular medications.  No regular nonsteroidal use.  No prior history of significant edema.  No recent flight or travels.  No recent dietary changes.  Denies any dyspnea or orthopnea.  No over-the-counter supplements.  No history of any renal problems.  She states her edema is essentially resolved today.  She does have history of gestational diabetes but no recent polyuria or polydipsia.  Past Medical History:  Diagnosis Date   Arthritis    oa hips   COVID 09/2020   congestion body aches x 7 days all symtposm reolved   Pelvic pain    Wears dentures    upper   Wears glasses    Past Surgical History:  Procedure Laterality Date   CESAREAN SECTION     x 2   CYSTOSCOPY N/A 03/24/2021   Procedure: CYSTOSCOPY;  Surgeon: Lavina Hamman, MD;  Location: [facility];  Service: Gynecology;  Laterality: N/A;   DILATION AND CURETTAGE OF UTERUS  2004   LAPAROSCOPIC BILATERAL SALPINGECTOMY Bilateral 03/24/2021   Procedure: LAPAROSCOPIC BILATERAL SALPINGECTOMY;  Surgeon: Lavina Hamman, MD;  Location: [facility] ;  Service: Gynecology;  Laterality: Bilateral;   LAPAROSCOPIC HYSTERECTOMY N/A 03/24/2021   Procedure: HYSTERECTOMY TOTAL LAPAROSCOPIC; INCISION OF VULVAR LESION ;  Surgeon: Lavina Hamman, MD;  Location: [facility] ;  Service: Gynecology;  Laterality: N/A;   TUBAL LIGATION  2011    reports that she has been smoking cigarettes. She has a 3.00 pack-year smoking history. She has never used  smokeless tobacco. She reports that she does not drink alcohol and does not use drugs. family history includes Breast cancer in her maternal grandfather; Cancer in her mother; Seizures in her maternal grandmother and niece. Allergies  Allergen Reactions   Hydrocodone Nausea And Vomiting    Review of Systems  Constitutional:  Negative for malaise/fatigue.  Eyes:  Negative for blurred vision.  Respiratory:  Negative for shortness of breath.   Cardiovascular:  Negative for chest pain.  Neurological:  Negative for dizziness, weakness and headaches.      Objective:     BP 114/70 (BP Location: Left Arm, Patient Position: Sitting, Cuff Size: Normal)   Pulse 100   Temp 97.9 F (36.6 C) (Oral)   Ht 5\' 3"  (1.6 m)   Wt 142 lb 9.6 oz (64.7 kg)   LMP 02/28/2021   SpO2 98%   BMI 25.26 kg/m  BP Readings from Last 3 Encounters:  05/15/23 114/70  03/02/23 112/79  03/02/23 101/72   Wt Readings from Last 3 Encounters:  05/15/23 142 lb 9.6 oz (64.7 kg)  03/02/23 143 lb (64.9 kg)  03/02/23 142 lb 9.6 oz (64.7 kg)      Physical Exam Vitals reviewed.  Constitutional:      Appearance: Normal appearance.  Cardiovascular:     Rate and Rhythm: Normal rate and regular rhythm.     Heart sounds: No murmur heard.    No  gallop.  Pulmonary:     Effort: Pulmonary effort is normal.     Breath sounds: Normal breath sounds. No wheezing or rales.  Musculoskeletal:     Right lower leg: No edema.     Left lower leg: No edema.  Neurological:     Mental Status: She is alert.      No results found for any visits on 05/15/23.  Last CBC Lab Results  Component Value Date   WBC 9.6 06/07/2022   HGB 14.8 06/07/2022   HCT 43.8 06/07/2022   MCV 94.9 06/07/2022   MCH 31.5 03/22/2021   RDW 13.4 06/07/2022   PLT 375.0 06/07/2022   Last metabolic panel Lab Results  Component Value Date   GLUCOSE 81 06/07/2022   NA 138 06/07/2022   K 4.1 06/07/2022   CL 102 06/07/2022   CO2 26 06/07/2022    BUN 11 06/07/2022   CREATININE 1.05 06/07/2022   GFR 66.95 06/07/2022   CALCIUM 9.8 06/07/2022   PROT 7.0 08/29/2019   ALBUMIN 4.7 08/29/2019   BILITOT 0.4 08/29/2019   ALKPHOS 62 08/29/2019   AST 12 08/29/2019   ALT 8 08/29/2019      The ASCVD Risk score (Arnett DK, et al., 2019) failed to calculate for the following reasons:   Cannot find a previous HDL lab   Cannot find a previous total cholesterol lab    Assessment & Plan:   Patient presents with onset few days ago of some bilateral lower extremity edema.  Edema has basically resolved at this time.  Etiology unclear.  Nonfocal exam.  Does not take any medications.  Lung exam clear.  No recent travels.  -Reassurance and observe for now. -Reminder to watch sodium intake -Follow-up for any recurrent edema.  If she has any recurrence of significant swelling consider further labs with CMP, TSH, urine microalbumin screen   Evelena Peat, MD

## 2023-05-15 NOTE — Patient Instructions (Signed)
If edema recurs let me know and we can get some labs.

## 2023-06-15 ENCOUNTER — Encounter: Payer: Self-pay | Admitting: Orthopaedic Surgery

## 2023-06-18 NOTE — Telephone Encounter (Signed)
Patient last saw Dr. Christell Constant 03/02/23. At that time Dr. Christell Constant discussed a plan of treatment with the patient. She was to follow up with him 6 weeks after that to discuss process, and to get x-xays of her neck. Does doctor need to see the patient for a rov, or would he like to proceed with scheduling surgery? Please advise.

## 2023-06-27 ENCOUNTER — Ambulatory Visit: Payer: BC Managed Care – PPO | Admitting: Orthopedic Surgery

## 2023-07-16 ENCOUNTER — Ambulatory Visit: Payer: BC Managed Care – PPO | Admitting: Orthopedic Surgery

## 2023-07-16 DIAGNOSIS — M5412 Radiculopathy, cervical region: Secondary | ICD-10-CM

## 2023-07-16 NOTE — Progress Notes (Signed)
Orthopedic Spine Surgery Office Note   Assessment: Patient is a 42 y.o. female with neck pain that radiates into the bilateral upper extremities.  It goes along the lateral aspect the shoulder, into the lateral arms, and then into the dorsal forearms     Plan: -Patient has tried Tylenol, NSAIDs, gabapentin, lyrica, PT -Patient has tried over six weeks of conservative treatment and pain has gotten progressively worse, so recommended MRI of the cervical spine to evaluate for radiculopathy -Explained that she would need to be nicotine free prior to any elective spine surgery. She is working on quitting -Will call her with the results of the MRI to discuss treatment options     Patient expressed understanding of the plan and all questions were answered to the patient's satisfaction.    ___________________________________________________________________________     History:   Patient is a 42 y.o. female who presents today for follow up on cervical spine. She is still having neck pain that radiates into her bilateral upper extremities. Her am pain is worse than her neck pain. She feels the pain radiates into the lateral arms and into the dorsal forearms. It now radiates into the bilateral hands. She feels the pain radiates into all of her fingers on both sides. She feels the pain is worse since the last time I saw her. Pain is felt on a daily basis and is there regardless of activity. She will wake at night due to the pain.  Treatments tried: Tylenol, NSAIDs, gabapentin, lyrica, PT    Physical Exam:   General: no acute distress, appears stated age Neurologic: alert, answering questions appropriately, following commands Respiratory: unlabored breathing on room air, symmetric chest rise Psychiatric: appropriate affect, normal cadence to speech     MSK (spine):   -Strength exam                                                   Left                  Right Grip strength                5/5                   5/5 Interosseus                  5/5                  5/5 Wrist extension            5/5                  5/5 Wrist flexion                 5/5                  5/5 Elbow flexion                5/5                  5/5 Deltoid                          5/5                  5/5     -  Sensory exam                           Sensation intact to light touch in C5-T1 nerve distributions of bilateral upper extremities   -Brachioradialis DTR: 2/4 on the left, 2/4 on the right -Biceps DTR: 2/4 on the left, 2/4 on the right -Triceps DTR: 2/4 on the left, 2/4 on the right   -Hoffman sign: negative bilaterally -Clonus: no beats bilaterally -Interosseous wasting: none seen -Grip and release test: negative -Romberg: negative -Gait: normal   Left shoulder exam: no pain through range of motion, negative jobe, no weakness with external rotation with arm at side, negative belly press Right shoulder exam: no pain through range of motion, negative jobe, no weakness with external rotation with arm at side, negative belly press   Tinel's at wrist: negative bilaterally Phalen's at wrist: negative bilaterally Durkan's: negative bilaterally   Tinel's at elbow: negative bilaterally   Imaging: XR of the cervical spine from 02/23/2023 and 03/02/2023 was previously independently reviewed and interpreted, showing decreased disc height at C5/6 with small anterior osteophyte formation. No other significant degenerative changes seen. No fracture or dislocation. No evidence of instability on flexion/extension views.      Patient name: Linda Hopkins Patient MRN: [id number] Date of visit: 07/16/23

## 2023-07-31 ENCOUNTER — Other Ambulatory Visit: Payer: Self-pay | Admitting: Family Medicine

## 2023-07-31 DIAGNOSIS — Z1231 Encounter for screening mammogram for malignant neoplasm of breast: Secondary | ICD-10-CM

## 2023-08-05 ENCOUNTER — Ambulatory Visit: Payer: BC Managed Care – PPO

## 2023-08-05 ENCOUNTER — Other Ambulatory Visit (HOSPITAL_BASED_OUTPATIENT_CLINIC_OR_DEPARTMENT_OTHER): Payer: BC Managed Care – PPO

## 2023-08-05 DIAGNOSIS — M5412 Radiculopathy, cervical region: Secondary | ICD-10-CM

## 2023-08-05 DIAGNOSIS — M50123 Cervical disc disorder at C6-C7 level with radiculopathy: Secondary | ICD-10-CM | POA: Diagnosis not present

## 2023-08-05 DIAGNOSIS — R531 Weakness: Secondary | ICD-10-CM | POA: Diagnosis not present

## 2023-08-07 ENCOUNTER — Ambulatory Visit: Payer: BC Managed Care – PPO

## 2023-08-10 ENCOUNTER — Telehealth: Payer: Self-pay | Admitting: Orthopedic Surgery

## 2023-08-10 NOTE — Telephone Encounter (Signed)
Orthopedic Telephone Call  Spoke with patient this evening on the telephone.  She is still having significant pain in her neck that radiates into the bilateral upper extremities.  She said this past week it was actually worse than it has been in a while.  She feels the pain on a daily basis.  She has tried conservative treatments for over 6 months now without any relief, so discussed operative management as an option for her.  I went over her most recent MRI that shows foraminal stenosis bilaterally at C5/6.  The plan would be to do a C5/6 anterior cervical discectomy and fusion.  The risks, benefits, alternatives were covered with her.  After this discussion, patient elected to proceed.  I will let my surgery scheduler know to begin working with her to try to get her on the schedule for a mutually convenient date.   London Sheer, MD Orthopedic Surgeon

## 2023-08-14 ENCOUNTER — Encounter: Payer: Self-pay | Admitting: Orthopedic Surgery

## 2023-08-14 DIAGNOSIS — M5412 Radiculopathy, cervical region: Secondary | ICD-10-CM

## 2023-08-17 ENCOUNTER — Telehealth: Payer: Self-pay

## 2023-08-17 ENCOUNTER — Other Ambulatory Visit: Payer: Self-pay | Admitting: Physical Medicine and Rehabilitation

## 2023-08-17 MED ORDER — DIAZEPAM 5 MG PO TABS
ORAL_TABLET | ORAL | 0 refills | Status: DC
Start: 2023-08-17 — End: 2023-10-09

## 2023-08-17 NOTE — Telephone Encounter (Signed)
Patient is scheduled for injection on 08/22/23. Needs pre procedure Valium sent to [facility]

## 2023-08-21 ENCOUNTER — Telehealth: Payer: Self-pay | Admitting: Physical Medicine and Rehabilitation

## 2023-08-21 NOTE — Telephone Encounter (Signed)
I called and spoke with the patient. She has been approved - copy of the determination in the chart. The letter was stating that it was approved, for the exact dates as on the approval in the chart, 08/20/23 - 08/31/23. The patient will have her injection, as planned, tomorrow with Dr. Alvester Morin.

## 2023-08-21 NOTE — Telephone Encounter (Signed)
Pt called in stating her insurance sent her a letter stating they are not covering her procedure with [facility] tomorrow please advise

## 2023-08-22 ENCOUNTER — Ambulatory Visit: Payer: BC Managed Care – PPO | Admitting: Physical Medicine and Rehabilitation

## 2023-08-22 ENCOUNTER — Other Ambulatory Visit: Payer: Self-pay

## 2023-08-22 DIAGNOSIS — M5412 Radiculopathy, cervical region: Secondary | ICD-10-CM | POA: Diagnosis not present

## 2023-08-22 MED ORDER — METHYLPREDNISOLONE ACETATE 40 MG/ML IJ SUSP
40.0000 mg | Freq: Once | INTRAMUSCULAR | Status: AC
Start: 2023-08-22 — End: 2023-08-22
  Administered 2023-08-22: 40 mg

## 2023-08-22 NOTE — Patient Instructions (Signed)

## 2023-08-22 NOTE — Progress Notes (Signed)
Functional Pain Scale - descriptive words and definitions  Uncomfortable (3)  Pain is present but can complete all ADL's/sleep is slightly affected and passive distraction only gives marginal relief. Mild range order  Average Pain 9   +Driver, -BT, -Dye Allergies. Yes, no, no  118/80 85 99%

## 2023-08-23 ENCOUNTER — Ambulatory Visit: Payer: BC Managed Care – PPO

## 2023-08-23 DIAGNOSIS — Z1231 Encounter for screening mammogram for malignant neoplasm of breast: Secondary | ICD-10-CM

## 2023-08-24 ENCOUNTER — Encounter: Payer: Self-pay | Admitting: Family Medicine

## 2023-08-24 ENCOUNTER — Ambulatory Visit (INDEPENDENT_AMBULATORY_CARE_PROVIDER_SITE_OTHER): Payer: BC Managed Care – PPO | Admitting: Family Medicine

## 2023-08-24 VITALS — BP 120/80 | HR 79 | Temp 98.7°F | Resp 12 | Ht 63.0 in | Wt 145.5 lb

## 2023-08-24 DIAGNOSIS — Z01818 Encounter for other preprocedural examination: Secondary | ICD-10-CM | POA: Diagnosis not present

## 2023-08-24 DIAGNOSIS — Z1159 Encounter for screening for other viral diseases: Secondary | ICD-10-CM | POA: Diagnosis not present

## 2023-08-24 NOTE — Patient Instructions (Addendum)
A few things to remember from today's visit:  Encounter for HCV screening test for low risk patient - Plan: Hepatitis C antibody  Pre-op evaluation - Plan: EKG 12-Lead, CBC, Basic Metabolic Panel with eGFR  Will complete pre op form and fax it you your orthopedist. Avoid medication and naproxen, ibuprofen, and Aspirin as well as over-the-counter supplements at least 7 days before surgery.  If any pain just take Tylenol. Continue avoiding smoking.  Do not use My Chart to request refills or for acute issues that need immediate attention. If you send a my chart message, it may take a few days to be addressed, specially if I am not in the office.  Please be sure medication list is accurate. If a new problem present, please set up appointment sooner than planned today.
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
## 2023-08-25 LAB — CBC
HCT: 41.4 % (ref 35.0–45.0)
Hemoglobin: 14 g/dL (ref 11.7–15.5)
MCH: 32 pg (ref 27.0–33.0)
MCHC: 33.8 g/dL (ref 32.0–36.0)
MCV: 94.5 fL (ref 80.0–100.0)
MPV: 9.9 fL (ref 7.5–12.5)
Platelets: 350 10*3/uL (ref 140–400)
RBC: 4.38 10*6/uL (ref 3.80–5.10)
RDW: 12.5 % (ref 11.0–15.0)
WBC: 12.6 10*3/uL — ABNORMAL HIGH (ref 3.8–10.8)

## 2023-08-25 LAB — HEPATITIS C ANTIBODY: Hepatitis C Ab: NONREACTIVE

## 2023-08-25 LAB — BASIC METABOLIC PANEL WITH GFR
BUN: 10 mg/dL (ref 7–25)
CO2: 25 mmol/L (ref 20–32)
Calcium: 9.7 mg/dL (ref 8.6–10.2)
Chloride: 103 mmol/L (ref 98–110)
Creat: 0.99 mg/dL (ref 0.50–0.99)
Glucose, Bld: 85 mg/dL (ref 65–99)
Potassium: 4 mmol/L (ref 3.5–5.3)
Sodium: 138 mmol/L (ref 135–146)
eGFR: 74 mL/min/{1.73_m2} (ref >=60)

## 2023-08-27 ENCOUNTER — Encounter: Payer: Self-pay | Admitting: Orthopedic Surgery

## 2023-08-28 NOTE — Telephone Encounter (Signed)
I spoke with the patient and advised GP has not sent clearance yet. I did read ov note from 10/18 where is says patient cleared for surgery. The patient has been scheduled for surgery on 10/08/23.

## 2023-08-30 ENCOUNTER — Other Ambulatory Visit: Payer: Self-pay | Admitting: Medical Genetics

## 2023-08-30 DIAGNOSIS — Z006 Encounter for examination for normal comparison and control in clinical research program: Secondary | ICD-10-CM

## 2023-08-31 ENCOUNTER — Telehealth: Payer: Self-pay | Admitting: Orthopedic Surgery

## 2023-08-31 NOTE — Telephone Encounter (Signed)
Pt came in to drop off Auth form and $25 for [employer]

## 2023-09-04 NOTE — Progress Notes (Signed)
Linda Hopkins - 42 y.o. female MRN [id number]  Date of birth: 10-Oct-1981  Office Visit Note: Visit Date: 08/22/2023 PCP: [place], Betty G, MD Referred by: London Sheer, MD  Subjective: No chief complaint on file.  HPI:  Linda Hopkins is a 42 y.o. female who comes in today at the request of Dr. Willia Craze for planned Right C7-T1 Cervical Interlaminar epidural steroid injection with fluoroscopic guidance.  The patient has failed conservative care including home exercise, medications, time and activity modification.  This injection will be diagnostic and hopefully therapeutic.  Please see requesting physician notes for further details and justification.   ROS Otherwise per HPI.  Assessment & Plan: Visit Diagnoses:    ICD-10-CM   1. Cervical radiculopathy  M54.12 XR C-ARM NO REPORT    Epidural Steroid injection    methylPREDNISolone acetate (DEPO-MEDROL) injection 40 mg      Plan: No additional findings.   Meds & Orders:  Meds ordered this encounter  Medications   methylPREDNISolone acetate (DEPO-MEDROL) injection 40 mg    Orders Placed This Encounter  Procedures   XR C-ARM NO REPORT   Epidural Steroid injection    Follow-up: Return for visit to requesting provider as needed.   Procedures: No procedures performed  Cervical Epidural Steroid Injection - Interlaminar Approach with Fluoroscopic Guidance  Patient: Linda Hopkins      Date of Birth: 1981/06/13 MRN: [id number] PCP: [place], Betty G, MD      Visit Date: 08/22/2023   Universal Protocol:    Date/Time: 09/04/2411:51 PM  Consent Given By: the patient  Position: PRONE  Additional Comments: Vital signs were monitored before and after the procedure. Patient was prepped and draped in the usual sterile fashion. The correct patient, procedure, and site was verified.   Injection Procedure Details:   Procedure diagnoses: Cervical radiculopathy [M54.12]    Meds Administered:  Meds ordered this  encounter  Medications   methylPREDNISolone acetate (DEPO-MEDROL) injection 40 mg     Laterality: Right  Location/Site: C7-T1  Needle: 3.5 in., 20 ga. Tuohy  Needle Placement: Paramedian epidural space  Findings:  -Comments: Excellent flow of contrast into the epidural space.  Procedure Details: Using a paramedian approach from the side mentioned above, the region overlying the inferior lamina was localized under fluoroscopic visualization and the soft tissues overlying this structure were infiltrated with 4 ml. of 1% Lidocaine without Epinephrine. A # 20 gauge, Tuohy needle was inserted into the epidural space using a paramedian approach.  The epidural space was localized using loss of resistance along with contralateral oblique bi-planar fluoroscopic views.  After negative aspirate for air, blood, and CSF, a 2 ml. volume of Isovue-250 was injected into the epidural space and the flow of contrast was observed. Radiographs were obtained for documentation purposes.   The injectate was administered into the level noted above.  Additional Comments:  No complications occurred Dressing: 2 x 2 sterile gauze and Band-Aid    Post-procedure details: Patient was observed during the procedure. Post-procedure instructions were reviewed.  Patient left the clinic in stable condition.   Clinical History: MRI CERVICAL SPINE WITHOUT CONTRAST   TECHNIQUE: Multiplanar, multisequence MR imaging of the cervical spine was performed. No intravenous contrast was administered.   COMPARISON:  Radiography 03/02/2023.  MRI 09/26/2019.   FINDINGS: Alignment: No malalignment.   Vertebrae: Normal   Cord: No cord compression or focal cord lesion.   Posterior Fossa, vertebral arteries, paraspinal tissues: Normal   Disc levels:  Foramen magnum, C1-2 and C2-3 are normal.   C3-4: Minimal uncovertebral prominence. No canal or foraminal narrowing.   C4-5: Minimal uncovertebral prominence. No  canal or foraminal narrowing.   C5-6: Bilateral uncovertebral hypertrophy and posterolateral disc bulges, slightly more prominent on the right than the left. Narrowing of the ventral subarachnoid space but no compression of the cord. AP diameter of the canal in the midline 9.1 mm. Bilateral foraminal stenosis could compress either C6 nerve. Similar appearance to the study of 2020.   C6-7: Bulging of the disc. Mild narrowing of the ventral subarachnoid space but no compressive effect upon the cord or foraminal stenosis.   C7-T1: Normal interspace.   IMPRESSION: 1. C5-6: Bilateral uncovertebral hypertrophy and posterolateral disc bulges, slightly more prominent on the right than the left. Narrowing of the ventral subarachnoid space but no compression of the cord. Bilateral foraminal stenosis could compress either C6 nerve. Similar appearance to the study of 2020. 2. C6-7: Mild bulging of the disc without compressive narrowing of the canal or foramina.     Electronically Signed   By: Paulina Fusi M.D.   On: 08/05/2023 16:08     Objective:  VS:  HT:    WT:   BMI:     BP:   HR: bpm  TEMP: ( )  RESP:  Physical Exam Vitals and nursing note reviewed.  Constitutional:      General: She is not in acute distress.    Appearance: Normal appearance. She is not ill-appearing.  HENT:     Head: Normocephalic and atraumatic.     Right Ear: External ear normal.     Left Ear: External ear normal.  Eyes:     Extraocular Movements: Extraocular movements intact.  Cardiovascular:     Rate and Rhythm: Normal rate.     Pulses: Normal pulses.  Musculoskeletal:     Cervical back: Tenderness present. No rigidity.     Right lower leg: No edema.     Left lower leg: No edema.     Comments: Patient has good strength in the upper extremities including 5 out of 5 strength in wrist extension long finger flexion and APB.  There is no atrophy of the hands intrinsically.  There is a negative  Hoffmann's test.   Lymphadenopathy:     Cervical: No cervical adenopathy.  Skin:    Findings: No erythema, lesion or rash.  Neurological:     General: No focal deficit present.     Mental Status: She is alert and oriented to person, place, and time.     Sensory: No sensory deficit.     Motor: No weakness or abnormal muscle tone.     Coordination: Coordination normal.  Psychiatric:        Mood and Affect: Mood normal.        Behavior: Behavior normal.      Imaging: No results found.

## 2023-09-04 NOTE — Procedures (Signed)
Cervical Epidural Steroid Injection - Interlaminar Approach with Fluoroscopic Guidance  Patient: Linda Hopkins      Date of Birth: Mar 07, 1981 MRN: [id number] PCP: [place], Betty G, MD      Visit Date: 08/22/2023   Universal Protocol:    Date/Time: 09/04/2411:51 PM  Consent Given By: the patient  Position: PRONE  Additional Comments: Vital signs were monitored before and after the procedure. Patient was prepped and draped in the usual sterile fashion. The correct patient, procedure, and site was verified.   Injection Procedure Details:   Procedure diagnoses: Cervical radiculopathy [M54.12]    Meds Administered:  Meds ordered this encounter  Medications   methylPREDNISolone acetate (DEPO-MEDROL) injection 40 mg     Laterality: Right  Location/Site: C7-T1  Needle: 3.5 in., 20 ga. Tuohy  Needle Placement: Paramedian epidural space  Findings:  -Comments: Excellent flow of contrast into the epidural space.  Procedure Details: Using a paramedian approach from the side mentioned above, the region overlying the inferior lamina was localized under fluoroscopic visualization and the soft tissues overlying this structure were infiltrated with 4 ml. of 1% Lidocaine without Epinephrine. A # 20 gauge, Tuohy needle was inserted into the epidural space using a paramedian approach.  The epidural space was localized using loss of resistance along with contralateral oblique bi-planar fluoroscopic views.  After negative aspirate for air, blood, and CSF, a 2 ml. volume of Isovue-250 was injected into the epidural space and the flow of contrast was observed. Radiographs were obtained for documentation purposes.   The injectate was administered into the level noted above.  Additional Comments:  No complications occurred Dressing: 2 x 2 sterile gauze and Band-Aid    Post-procedure details: Patient was observed during the procedure. Post-procedure instructions were reviewed.  Patient  left the clinic in stable condition.
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
## 2023-09-07 ENCOUNTER — Ambulatory Visit: Payer: BC Managed Care – PPO | Admitting: Orthopedic Surgery

## 2023-09-07 ENCOUNTER — Encounter: Payer: Self-pay | Admitting: Neurology

## 2023-09-07 ENCOUNTER — Ambulatory Visit (INDEPENDENT_AMBULATORY_CARE_PROVIDER_SITE_OTHER): Payer: BC Managed Care – PPO | Admitting: Neurology

## 2023-09-07 VITALS — BP 103/68 | HR 85 | Ht 63.0 in | Wt 150.8 lb

## 2023-09-07 DIAGNOSIS — G43709 Chronic migraine without aura, not intractable, without status migrainosus: Secondary | ICD-10-CM | POA: Diagnosis not present

## 2023-09-07 NOTE — Progress Notes (Signed)
Medication Samples have been provided to the patient.  Drug name: ZavZpret       Strength:         Qty: 2  LOT: [id number]  Exp.Date: 11/2024  Dosing instructions: as needed  The patient has been instructed regarding the correct time, dose, and frequency of taking this medication, including desired effects and most common side effects.   Leida Lauth 10:13 AM 09/07/2023

## 2023-09-07 NOTE — Patient Instructions (Signed)
Refer to [facility] for Biofeedback At earliest onset of migraine, take Zavzpret nasal spray - 1 spray in one nostril daily as needed.  Let me know if it is effective or not. Limit use of pain relievers to no more than 2 days out of week to prevent risk of rebound or medication-overuse headache. Keep headache diary Follow up 6 months.

## 2023-09-10 ENCOUNTER — Encounter: Payer: Self-pay | Admitting: Orthopedic Surgery

## 2023-09-10 ENCOUNTER — Telehealth: Payer: Self-pay | Admitting: Orthopedic Surgery

## 2023-09-10 NOTE — Telephone Encounter (Signed)
Disregard

## 2023-09-10 NOTE — Telephone Encounter (Signed)
Linda Hopkins Life forms received. To [employer].

## 2023-09-14 ENCOUNTER — Other Ambulatory Visit (HOSPITAL_COMMUNITY)
Admission: RE | Admit: 2023-09-14 | Discharge: 2023-09-14 | Disposition: A | Discharge status: Home / Self Care | Payer: BC Managed Care – PPO | Source: Ambulatory Visit | Attending: Oncology | Admitting: Oncology

## 2023-09-14 DIAGNOSIS — Z006 Encounter for examination for normal comparison and control in clinical research program: Secondary | ICD-10-CM | POA: Insufficient documentation

## 2023-09-25 LAB — HELIX MOLECULAR SCREEN: Genetic Analysis Overall Interpretation: NEGATIVE

## 2023-09-25 LAB — GENECONNECT MOLECULAR SCREEN

## 2023-10-03 NOTE — Progress Notes (Signed)
Surgical Instructions   Your procedure is scheduled on Monday October 08, 2023. Report to [facility] Main Entrance "A" at 5:30 A.M., then check in with the Admitting office. Any questions or running late day of surgery: call [phone]  Questions prior to your surgery date: call [phone], Monday-Friday, 8am-4pm. If you experience any cold or flu symptoms such as cough, fever, chills, shortness of breath, etc. between now and your scheduled surgery, please notify us at the above number.     Remember:  Do not eat after midnight the night before your surgery  You may drink clear liquids until 4:30 the morning of your surgery.   Clear liquids allowed are: Water, Non-Citrus Juices (without pulp), Carbonated Beverages, Clear Tea (no milk, honey, etc.), Black Coffee Only (NO MILK, CREAM OR POWDERED CREAMER of any kind), and Gatorade.  Patient Instructions  The night before surgery:  No food after midnight. ONLY clear liquids after midnight  The day of surgery (if you do NOT have diabetes):  Drink ONE (1) Pre-Surgery Clear Ensure by 4:30 the morning of surgery. Drink in one sitting. Do not sip.  This drink was given to you during your hospital  pre-op appointment visit.  Nothing else to drink after completing the  Pre-Surgery Clear Ensure.         If you have questions, please contact your surgeon's office.     Take these medicines the morning of surgery with A SIP OF WATER:  NONE  One week prior to surgery, STOP taking any Aspirin (unless otherwise instructed by your surgeon) Aleve, Naproxen, Ibuprofen, Motrin, Advil, Goody's, BC's, all herbal medications, fish oil, and non-prescription vitamins.                     Do NOT Smoke (Tobacco/Vaping) for 24 hours prior to your procedure.  If you use a CPAP at night, you may bring your mask/headgear for your overnight stay.   You will be asked to remove any contacts, glasses, piercing's, hearing aid's, dentures/partials prior to  surgery. Please bring cases for these items if needed.    Patients discharged the day of surgery will not be allowed to drive home, and someone needs to stay with them for 24 hours.  SURGICAL WAITING ROOM VISITATION Patients may have no more than 2 support people in the waiting area - these visitors may rotate.   Pre-op nurse will coordinate an appropriate time for 1 ADULT support person, who may not rotate, to accompany patient in pre-op.  Children under the age of 15 must have an adult with them who is not the patient and must remain in the main waiting area with an adult.  If the patient needs to stay at the hospital during part of their recovery, the visitor guidelines for inpatient rooms apply.  Please refer to the [facility] website for the visitor guidelines for any additional information.   If you received a COVID test during your pre-op visit  it is requested that you wear a mask when out in public, stay away from anyone that may not be feeling well and notify your surgeon if you develop symptoms. If you have been in contact with anyone that has tested positive in the last 10 days please notify you surgeon.      Pre-operative 5 CHG Bathing Instructions   You can play a key role in reducing the risk of infection after surgery. Your skin needs to be as free of germs as possible. You  can reduce the number of germs on your skin by washing with CHG (chlorhexidine gluconate) soap before surgery. CHG is an antiseptic soap that kills germs and continues to kill germs even after washing.   DO NOT use if you have an allergy to chlorhexidine/CHG or antibacterial soaps. If your skin becomes reddened or irritated, stop using the CHG and notify one of our RNs at [phone].   Please shower with the CHG soap starting 4 days before surgery using the following schedule:     Please keep in mind the following:  DO NOT shave, including legs and underarms, starting the day of your first shower.    You may shave your face at any point before/day of surgery.  Place clean sheets on your bed the day you start using CHG soap. Use a clean washcloth (not used since being washed) for each shower. DO NOT sleep with pets once you start using the CHG.   CHG Shower Instructions:  Wash your face and private area with normal soap. If you choose to wash your hair, wash first with your normal shampoo.  After you use shampoo/soap, rinse your hair and body thoroughly to remove shampoo/soap residue.  Turn the water OFF and apply about 3 tablespoons (45 ml) of CHG soap to a CLEAN washcloth.  Apply CHG soap ONLY FROM YOUR NECK DOWN TO YOUR TOES (washing for 3-5 minutes)  DO NOT use CHG soap on face, private areas, open wounds, or sores.  Pay special attention to the area where your surgery is being performed.  If you are having back surgery, having someone wash your back for you may be helpful. Wait 2 minutes after CHG soap is applied, then you may rinse off the CHG soap.  Pat dry with a clean towel  Put on clean clothes/pajamas   If you choose to wear lotion, please use ONLY the CHG-compatible lotions on the back of this paper.   Additional instructions for the day of surgery: DO NOT APPLY any lotions, deodorants or perfumes.   Do not bring valuables to the hospital. [facility] is not responsible for any belongings/valuables. Do not wear nail polish, gel polish, artificial nails, or any other type of covering on natural nails (fingers and toes) Do not wear jewelry or makeup Put on clean/comfortable clothes.  Please brush your teeth.  Ask your nurse before applying any prescription medications to the skin.     CHG Compatible Lotions   Aveeno Moisturizing lotion  Cetaphil Moisturizing Cream  Cetaphil Moisturizing Lotion  Clairol Herbal Essence Moisturizing Lotion, Dry Skin  Clairol Herbal Essence Moisturizing Lotion, Extra Dry Skin  Clairol Herbal Essence Moisturizing Lotion, Normal Skin   Curel Age Defying Therapeutic Moisturizing Lotion with Alpha Hydroxy  Curel Extreme Care Body Lotion  Curel Soothing Hands Moisturizing Hand Lotion  Curel Therapeutic Moisturizing Cream, Fragrance-Free  Curel Therapeutic Moisturizing Lotion, Fragrance-Free  Curel Therapeutic Moisturizing Lotion, Original Formula  Eucerin Daily Replenishing Lotion  Eucerin Dry Skin Therapy Plus Alpha Hydroxy Crme  Eucerin Dry Skin Therapy Plus Alpha Hydroxy Lotion  Eucerin Original Crme  Eucerin Original Lotion  Eucerin Plus Crme Eucerin Plus Lotion  Eucerin TriLipid Replenishing Lotion  Keri Anti-Bacterial Hand Lotion  Keri Deep Conditioning Original Lotion Dry Skin Formula Softly Scented  Keri Deep Conditioning Original Lotion, Fragrance Free Sensitive Skin Formula  Keri Lotion Fast Absorbing Fragrance Free Sensitive Skin Formula  Keri Lotion Fast Absorbing Softly Scented Dry Skin Formula  Keri Original Lotion  Keri Skin Renewal Lotion  Keri Silky Smooth Lotion  Keri Silky Smooth Sensitive Skin Lotion  Nivea Body Creamy Conditioning Patent examiner Moisturizing Lotion Nivea Crme  Nivea Skin Firming Lotion  NutraDerm 30 Skin Lotion  NutraDerm Skin Lotion  NutraDerm Therapeutic Skin Cream  NutraDerm Therapeutic Skin Lotion  ProShield Protective Hand Cream  Provon moisturizing lotion  Please read over the following fact sheets that you were given.

## 2023-10-05 ENCOUNTER — Encounter (HOSPITAL_COMMUNITY)
Admission: RE | Admit: 2023-10-05 | Discharge: 2023-10-05 | Disposition: A | Discharge status: Home / Self Care | Payer: BC Managed Care – PPO | Source: Ambulatory Visit | Attending: Orthopedic Surgery | Admitting: Orthopedic Surgery

## 2023-10-05 ENCOUNTER — Encounter (HOSPITAL_COMMUNITY): Payer: Self-pay

## 2023-10-05 ENCOUNTER — Other Ambulatory Visit: Payer: Self-pay

## 2023-10-05 VITALS — BP 112/79 | HR 84 | Temp 98.2°F | Resp 18 | Ht 63.0 in | Wt 152.7 lb

## 2023-10-05 DIAGNOSIS — M5412 Radiculopathy, cervical region: Secondary | ICD-10-CM | POA: Diagnosis not present

## 2023-10-05 DIAGNOSIS — Z01812 Encounter for preprocedural laboratory examination: Secondary | ICD-10-CM | POA: Diagnosis not present

## 2023-10-05 DIAGNOSIS — G8929 Other chronic pain: Secondary | ICD-10-CM | POA: Insufficient documentation

## 2023-10-05 DIAGNOSIS — Z01818 Encounter for other preprocedural examination: Secondary | ICD-10-CM

## 2023-10-05 LAB — CBC
HCT: 44.4 % (ref 36.0–46.0)
Hemoglobin: 14.5 g/dL (ref 12.0–15.0)
MCH: 30.9 pg (ref 26.0–34.0)
MCHC: 32.7 g/dL (ref 30.0–36.0)
MCV: 94.5 fL (ref 80.0–100.0)
Platelets: 374 10*3/uL (ref 150–400)
RBC: 4.7 MIL/uL (ref 3.87–5.11)
RDW: 12.9 % (ref 11.5–15.5)
WBC: 9.5 10*3/uL (ref 4.0–10.5)
nRBC: 0 % (ref 0.0–0.2)

## 2023-10-05 LAB — SURGICAL PCR SCREEN
MRSA, PCR: NEGATIVE
Staphylococcus aureus: POSITIVE — AB

## 2023-10-05 NOTE — Progress Notes (Signed)
Per lab, BMET hemolyzed.  Will need to recollect DOS.

## 2023-10-05 NOTE — Progress Notes (Signed)
PCP - Dr. Betty [place] Cardiologist - denies  PPM/ICD - denies Device Orders - n/a Rep Notified - n/a  Chest x-ray - denies EKG - 08/24/23 Stress Test - denies ECHO - denies Cardiac Cath - denies  Sleep Study - denies CPAP - n/a  Fasting Blood Sugar - n/a   Blood Thinner Instructions: n/a Aspirin Instructions: n/a  ERAS Protcol - clears until 0430 PRE-SURGERY Ensure - by 0430  COVID TEST- n/a   Anesthesia review: no  Patient denies shortness of breath, fever, cough and chest pain at PAT appointment   All instructions explained to the patient, with a verbal understanding of the material. Patient agrees to go over the instructions while at home for a better understanding. Patient also instructed to self quarantine after being tested for COVID-19. The opportunity to ask questions was provided.

## 2023-10-08 ENCOUNTER — Other Ambulatory Visit: Payer: Self-pay

## 2023-10-08 ENCOUNTER — Observation Stay (HOSPITAL_COMMUNITY): Payer: BC Managed Care – PPO

## 2023-10-08 ENCOUNTER — Observation Stay (HOSPITAL_COMMUNITY)
Admission: RE | Admit: 2023-10-08 | Discharge: 2023-10-09 | Disposition: A | Discharge status: Home / Self Care | Payer: BC Managed Care – PPO | Attending: Orthopedic Surgery | Admitting: Orthopedic Surgery

## 2023-10-08 ENCOUNTER — Encounter (HOSPITAL_COMMUNITY)
Admission: RE | Disposition: A | Discharge status: Home / Self Care | Payer: Self-pay | Source: Home / Self Care | Attending: Orthopedic Surgery

## 2023-10-08 ENCOUNTER — Ambulatory Visit (HOSPITAL_COMMUNITY): Payer: BC Managed Care – PPO

## 2023-10-08 ENCOUNTER — Ambulatory Visit (HOSPITAL_COMMUNITY): Payer: BC Managed Care – PPO | Admitting: Anesthesiology

## 2023-10-08 ENCOUNTER — Ambulatory Visit (HOSPITAL_COMMUNITY): Payer: BC Managed Care – PPO | Admitting: Vascular Surgery

## 2023-10-08 DIAGNOSIS — Z87891 Personal history of nicotine dependence: Secondary | ICD-10-CM | POA: Insufficient documentation

## 2023-10-08 DIAGNOSIS — G8929 Other chronic pain: Secondary | ICD-10-CM | POA: Diagnosis not present

## 2023-10-08 DIAGNOSIS — M5412 Radiculopathy, cervical region: Secondary | ICD-10-CM | POA: Diagnosis not present

## 2023-10-08 DIAGNOSIS — Z981 Arthrodesis status: Secondary | ICD-10-CM | POA: Diagnosis not present

## 2023-10-08 DIAGNOSIS — M4802 Spinal stenosis, cervical region: Principal | ICD-10-CM | POA: Insufficient documentation

## 2023-10-08 DIAGNOSIS — Z4789 Encounter for other orthopedic aftercare: Secondary | ICD-10-CM | POA: Diagnosis not present

## 2023-10-08 DIAGNOSIS — M50122 Cervical disc disorder at C5-C6 level with radiculopathy: Secondary | ICD-10-CM | POA: Diagnosis not present

## 2023-10-08 DIAGNOSIS — M50022 Cervical disc disorder at C5-C6 level with myelopathy: Secondary | ICD-10-CM | POA: Diagnosis not present

## 2023-10-08 DIAGNOSIS — Z01818 Encounter for other preprocedural examination: Secondary | ICD-10-CM

## 2023-10-08 HISTORY — PX: ANTERIOR CERVICAL DECOMP/DISCECTOMY FUSION: SHX1161

## 2023-10-08 SURGERY — ANTERIOR CERVICAL DECOMPRESSION/DISCECTOMY FUSION 1 LEVEL
Anesthesia: General

## 2023-10-08 MED ORDER — THROMBIN 20000 UNITS EX KIT
PACK | CUTANEOUS | Status: DC | PRN
  Administered 2023-10-08: 20 mL via TOPICAL

## 2023-10-08 MED ORDER — PROPOFOL 10 MG/ML IV BOLUS
INTRAVENOUS | Status: AC
  Filled 2023-10-08: qty 20

## 2023-10-08 MED ORDER — CHLORHEXIDINE GLUCONATE 0.12 % MT SOLN
15.0000 mL | Freq: Once | OROMUCOSAL | Status: AC
  Administered 2023-10-08: 15 mL via OROMUCOSAL
  Filled 2023-10-08: qty 15

## 2023-10-08 MED ORDER — SENNA 8.6 MG PO TABS
1.0000 | ORAL_TABLET | Freq: Two times a day (BID) | ORAL | Status: DC
  Administered 2023-10-08 – 2023-10-09 (×2): 8.6 mg via ORAL
  Filled 2023-10-08 (×2): qty 1

## 2023-10-08 MED ORDER — ACETAMINOPHEN 10 MG/ML IV SOLN
INTRAVENOUS | Status: DC | PRN
  Administered 2023-10-08: 1000 mg via INTRAVENOUS

## 2023-10-08 MED ORDER — FENTANYL CITRATE (PF) 250 MCG/5ML IJ SOLN
INTRAMUSCULAR | Status: AC
  Filled 2023-10-08: qty 5

## 2023-10-08 MED ORDER — POVIDONE-IODINE 10 % EX SWAB
2.0000 | Freq: Once | CUTANEOUS | Status: AC
  Administered 2023-10-08: 2 via TOPICAL

## 2023-10-08 MED ORDER — ROCURONIUM BROMIDE 10 MG/ML (PF) SYRINGE
PREFILLED_SYRINGE | INTRAVENOUS | Status: DC | PRN
  Administered 2023-10-08: 10 mg via INTRAVENOUS

## 2023-10-08 MED ORDER — PROPOFOL 500 MG/50ML IV EMUL
INTRAVENOUS | Status: DC | PRN
  Administered 2023-10-08: 100 ug/kg/min via INTRAVENOUS

## 2023-10-08 MED ORDER — OXYCODONE HCL 5 MG/5ML PO SOLN
5.0000 mg | Freq: Once | ORAL | Status: DC | PRN
Start: 2023-10-08 — End: 2023-10-08

## 2023-10-08 MED ORDER — ONDANSETRON HCL 4 MG PO TABS
4.0000 mg | ORAL_TABLET | Freq: Four times a day (QID) | ORAL | Status: DC | PRN
Start: 2023-10-08 — End: 2023-10-09

## 2023-10-08 MED ORDER — TRANEXAMIC ACID-NACL 1000-0.7 MG/100ML-% IV SOLN
1000.0000 mg | INTRAVENOUS | Status: AC
  Administered 2023-10-08: 1000 mg via INTRAVENOUS
  Filled 2023-10-08: qty 100

## 2023-10-08 MED ORDER — OXYCODONE HCL 5 MG PO TABS: 5.0000 mg | ORAL_TABLET | Freq: Once | ORAL | Status: DC | PRN

## 2023-10-08 MED ORDER — HYDROMORPHONE HCL 1 MG/ML IJ SOLN
INTRAMUSCULAR | Status: DC | PRN
  Administered 2023-10-08: .5 mg via INTRAVENOUS

## 2023-10-08 MED ORDER — 0.9 % SODIUM CHLORIDE (POUR BTL) OPTIME
TOPICAL | Status: DC | PRN
  Administered 2023-10-08: 1000 mL

## 2023-10-08 MED ORDER — ACETAMINOPHEN 500 MG PO TABS: 1000.0000 mg | ORAL_TABLET | Freq: Once | ORAL | Status: DC

## 2023-10-08 MED ORDER — ORAL CARE MOUTH RINSE: 15.0000 mL | Freq: Once | OROMUCOSAL | Status: AC

## 2023-10-08 MED ORDER — PHENYLEPHRINE HCL-NACL 20-0.9 MG/250ML-% IV SOLN
INTRAVENOUS | Status: DC | PRN
  Administered 2023-10-08: 45 ug/min via INTRAVENOUS

## 2023-10-08 MED ORDER — MIDAZOLAM HCL 2 MG/2ML IJ SOLN
INTRAMUSCULAR | Status: DC | PRN
  Administered 2023-10-08: 2 mg via INTRAVENOUS

## 2023-10-08 MED ORDER — METHOCARBAMOL 500 MG PO TABS
500.0000 mg | ORAL_TABLET | Freq: Four times a day (QID) | ORAL | Status: DC
  Administered 2023-10-08 – 2023-10-09 (×3): 500 mg via ORAL
  Filled 2023-10-08 (×2): qty 1

## 2023-10-08 MED ORDER — MIDAZOLAM HCL 2 MG/2ML IJ SOLN
INTRAMUSCULAR | Status: AC
  Filled 2023-10-08: qty 2

## 2023-10-08 MED ORDER — HYDROXYZINE HCL 50 MG/ML IM SOLN
50.0000 mg | Freq: Four times a day (QID) | INTRAMUSCULAR | Status: DC | PRN
  Administered 2023-10-08: 50 mg via INTRAMUSCULAR
  Filled 2023-10-08: qty 1

## 2023-10-08 MED ORDER — ACETAMINOPHEN 325 MG PO TABS: 325.0000 mg | ORAL_TABLET | ORAL | Status: DC | PRN

## 2023-10-08 MED ORDER — ACETAMINOPHEN 10 MG/ML IV SOLN: 1000.0000 mg | Freq: Once | INTRAVENOUS | Status: DC | PRN

## 2023-10-08 MED ORDER — GLYCOPYRROLATE 0.2 MG/ML IJ SOLN
INTRAMUSCULAR | Status: DC | PRN
  Administered 2023-10-08: .2 mg via INTRAVENOUS

## 2023-10-08 MED ORDER — ACETAMINOPHEN 160 MG/5ML PO SOLN: 325.0000 mg | ORAL | Status: DC | PRN

## 2023-10-08 MED ORDER — METHOCARBAMOL 500 MG PO TABS
ORAL_TABLET | ORAL | Status: AC
  Filled 2023-10-08: qty 1

## 2023-10-08 MED ORDER — THROMBIN 20000 UNITS EX KIT
PACK | CUTANEOUS | Status: AC
  Filled 2023-10-08: qty 1

## 2023-10-08 MED ORDER — CEFAZOLIN SODIUM-DEXTROSE 2-4 GM/100ML-% IV SOLN
2.0000 g | Freq: Four times a day (QID) | INTRAVENOUS | Status: AC
  Administered 2023-10-08 (×2): 2 g via INTRAVENOUS
  Filled 2023-10-08 (×2): qty 100

## 2023-10-08 MED ORDER — SODIUM CHLORIDE 0.9 % IV SOLN
0.1500 ug/kg/min | INTRAVENOUS | Status: AC
  Administered 2023-10-08: .2 ug/kg/min via INTRAVENOUS
  Filled 2023-10-08: qty 2000

## 2023-10-08 MED ORDER — FENTANYL CITRATE (PF) 100 MCG/2ML IJ SOLN
25.0000 ug | INTRAMUSCULAR | Status: DC | PRN
  Administered 2023-10-08: 50 ug via INTRAVENOUS

## 2023-10-08 MED ORDER — ONDANSETRON HCL 4 MG/2ML IJ SOLN: 4.0000 mg | Freq: Four times a day (QID) | INTRAMUSCULAR | Status: DC | PRN

## 2023-10-08 MED ORDER — CEFAZOLIN SODIUM-DEXTROSE 2-4 GM/100ML-% IV SOLN
2.0000 g | INTRAVENOUS | Status: AC
  Administered 2023-10-08: 2 g via INTRAVENOUS
  Filled 2023-10-08: qty 100

## 2023-10-08 MED ORDER — POLYETHYLENE GLYCOL 3350 17 G PO PACK
17.0000 g | PACK | Freq: Every day | ORAL | Status: DC
  Administered 2023-10-08: 17 g via ORAL
  Filled 2023-10-08: qty 1

## 2023-10-08 MED ORDER — HYDROMORPHONE HCL 1 MG/ML IJ SOLN: 0.5000 mg | INTRAMUSCULAR | Status: AC | PRN

## 2023-10-08 MED ORDER — LACTATED RINGERS IV SOLN
INTRAVENOUS | Status: DC
Start: 2023-10-08 — End: 2023-10-08

## 2023-10-08 MED ORDER — DEXAMETHASONE SODIUM PHOSPHATE 10 MG/ML IJ SOLN
INTRAMUSCULAR | Status: DC | PRN
  Administered 2023-10-08: 10 mg via INTRAVENOUS

## 2023-10-08 MED ORDER — HYDROMORPHONE HCL 1 MG/ML IJ SOLN
INTRAMUSCULAR | Status: AC
  Filled 2023-10-08: qty 0.5

## 2023-10-08 MED ORDER — OXYCODONE-ACETAMINOPHEN 5-325 MG PO TABS
1.0000 | ORAL_TABLET | ORAL | Status: DC | PRN
  Administered 2023-10-08 (×2): 1 via ORAL
  Filled 2023-10-08: qty 2
  Filled 2023-10-08 (×2): qty 1

## 2023-10-08 MED ORDER — SUCCINYLCHOLINE CHLORIDE 200 MG/10ML IV SOSY
PREFILLED_SYRINGE | INTRAVENOUS | Status: DC | PRN
  Administered 2023-10-08: 120 mg via INTRAVENOUS

## 2023-10-08 MED ORDER — PROPOFOL 10 MG/ML IV BOLUS
INTRAVENOUS | Status: DC | PRN
  Administered 2023-10-08: 150 mg via INTRAVENOUS

## 2023-10-08 MED ORDER — SCOPOLAMINE 1 MG/3DAYS TD PT72
1.0000 | MEDICATED_PATCH | TRANSDERMAL | Status: DC
Start: 2023-10-08 — End: 2023-10-08

## 2023-10-08 MED ORDER — TRANEXAMIC ACID-NACL 1000-0.7 MG/100ML-% IV SOLN
1000.0000 mg | Freq: Once | INTRAVENOUS | Status: AC
  Administered 2023-10-08: 1000 mg via INTRAVENOUS
  Filled 2023-10-08: qty 100

## 2023-10-08 MED ORDER — ONDANSETRON HCL 4 MG/2ML IJ SOLN
INTRAMUSCULAR | Status: DC | PRN
  Administered 2023-10-08: 4 mg via INTRAVENOUS

## 2023-10-08 MED ORDER — OXYCODONE HCL 5 MG PO TABS
5.0000 mg | ORAL_TABLET | ORAL | Status: DC | PRN
  Filled 2023-10-08: qty 2

## 2023-10-08 MED ORDER — VANCOMYCIN HCL 1000 MG IV SOLR
INTRAVENOUS | Status: AC
  Filled 2023-10-08: qty 20

## 2023-10-08 MED ORDER — ACETAMINOPHEN 500 MG PO TABS: 1000.0000 mg | ORAL_TABLET | Freq: Three times a day (TID) | ORAL | Status: DC

## 2023-10-08 MED ORDER — LIDOCAINE 2% (20 MG/ML) 5 ML SYRINGE
INTRAMUSCULAR | Status: DC | PRN
  Administered 2023-10-08: 40 mg via INTRAVENOUS

## 2023-10-08 MED ORDER — FENTANYL CITRATE (PF) 100 MCG/2ML IJ SOLN
INTRAMUSCULAR | Status: AC
  Filled 2023-10-08: qty 2

## 2023-10-08 MED ORDER — DROPERIDOL 2.5 MG/ML IJ SOLN: 0.6250 mg | Freq: Once | INTRAMUSCULAR | Status: DC | PRN

## 2023-10-08 MED ORDER — FENTANYL CITRATE (PF) 250 MCG/5ML IJ SOLN
INTRAMUSCULAR | Status: DC | PRN
  Administered 2023-10-08: 50 ug via INTRAVENOUS
  Administered 2023-10-08 (×2): 100 ug via INTRAVENOUS

## 2023-10-08 MED ORDER — DEXAMETHASONE SODIUM PHOSPHATE 10 MG/ML IJ SOLN
8.0000 mg | Freq: Once | INTRAMUSCULAR | Status: DC
  Filled 2023-10-08: qty 1

## 2023-10-08 MED ORDER — ACETAMINOPHEN 10 MG/ML IV SOLN
INTRAVENOUS | Status: AC
  Filled 2023-10-08: qty 100

## 2023-10-08 SURGICAL SUPPLY — 67 items
ALCOHOL 70% 16 OZ (MISCELLANEOUS) ×2 IMPLANT
ALLOGRAFT TRIAD LORDOTIC CC (Bone Implant) IMPLANT
BAG COUNTER SPONGE SURGICOUNT (BAG) ×2 IMPLANT
BAND RUBBER #18 3X1/16 STRL (MISCELLANEOUS) ×4 IMPLANT
BENZOIN TINCTURE PRP APPL 2/3 (GAUZE/BANDAGES/DRESSINGS) IMPLANT
BLADE CLIPPER SURG (BLADE) IMPLANT
BUR MATCHSTICK NEURO 3.0 LAGG (BURR) ×2 IMPLANT
CABLE BIPOLOR RESECTION CORD (MISCELLANEOUS) ×2 IMPLANT
CANISTER SUCT 3000ML PPV (MISCELLANEOUS) ×2 IMPLANT
CLSR STERI-STRIP ANTIMIC 1/2X4 (GAUZE/BANDAGES/DRESSINGS) ×2 IMPLANT
COLLAR CERV LO CONTOUR FIRM DE (SOFTGOODS) IMPLANT
COVER MAYO STAND STRL (DRAPES) ×4 IMPLANT
COVER SURGICAL LIGHT HANDLE (MISCELLANEOUS) ×4 IMPLANT
DRAIN CHANNEL 15F RND FF W/TCR (WOUND CARE) IMPLANT
DRAIN PENROSE 18X1/4 LTX STRL (DRAIN) IMPLANT
DRAPE C-ARM 42X72 X-RAY (DRAPES) ×2 IMPLANT
DRAPE LAPAROTOMY 100X72X124 (DRAPES) ×2 IMPLANT
DRAPE MICROSCOPE LEICA (MISCELLANEOUS) ×2 IMPLANT
DRAPE MICROSCOPE NONGLARE (MISCELLANEOUS) ×2 IMPLANT
DRAPE POUCH INSTRU U-SHP 10X18 (DRAPES) ×2 IMPLANT
DRAPE SURG 17X11 SM STRL (DRAPES) ×8 IMPLANT
DRAPE SURG 17X23 STRL (DRAPES) ×2 IMPLANT
DRAPE U-SHAPE 47X51 STRL (DRAPES) ×2 IMPLANT
DRAPE UTILITY XL STRL (DRAPES) ×2 IMPLANT
DRSG OPSITE POSTOP 3X4 (GAUZE/BANDAGES/DRESSINGS) ×2 IMPLANT
DURAPREP 26ML APPLICATOR (WOUND CARE) ×2 IMPLANT
ELECT BLADE INSULATED 4IN (ELECTROSURGICAL) ×1 IMPLANT
ELECT COATED BLADE 2.86 ST (ELECTRODE) ×2 IMPLANT
ELECT REM PT RETURN 9FT ADLT (ELECTROSURGICAL) ×1 IMPLANT
ELECTRODE BLADE INSULATED 4IN (ELECTROSURGICAL) ×2 IMPLANT
ELECTRODE REM PT RTRN 9FT ADLT (ELECTROSURGICAL) ×2 IMPLANT
FEE INTRAOP CADWELL SUPPLY NCS (MISCELLANEOUS) IMPLANT
FEE INTRAOP MONITOR IMPULS NCS (MISCELLANEOUS) IMPLANT
GAUZE SPONGE 4X4 12PLY STRL (GAUZE/BANDAGES/DRESSINGS) IMPLANT
GLOVE BIO SURGEON STRL SZ7.5 (GLOVE) ×4 IMPLANT
GLOVE INDICATOR 7.5 STRL GRN (GLOVE) ×2 IMPLANT
GOWN STRL REUS W/ TWL LRG LVL3 (GOWN DISPOSABLE) ×2 IMPLANT
GOWN STRL SURGICAL XL XLNG (GOWN DISPOSABLE) ×2 IMPLANT
INTRAOP CADWELL SUPPLY FEE NCS (MISCELLANEOUS) ×1 IMPLANT
INTRAOP MONITOR FEE IMPULS NCS (MISCELLANEOUS) ×1 IMPLANT
KIT BASIN OR (CUSTOM PROCEDURE TRAY) ×2 IMPLANT
KIT TURNOVER KIT B (KITS) ×2 IMPLANT
NDL HYPO 22X1.5 SAFETY MO (MISCELLANEOUS) ×2 IMPLANT
NDL SPNL 18GX3.5 QUINCKE PK (NEEDLE) ×2 IMPLANT
NEEDLE HYPO 22X1.5 SAFETY MO (MISCELLANEOUS) ×1 IMPLANT
NEEDLE SPNL 18GX3.5 QUINCKE PK (NEEDLE) ×1 IMPLANT
NS IRRIG 1000ML POUR BTL (IV SOLUTION) ×2 IMPLANT
PACK ORTHO CERVICAL (CUSTOM PROCEDURE TRAY) ×2 IMPLANT
PACK UNIVERSAL I (CUSTOM PROCEDURE TRAY) ×2 IMPLANT
PAD ARMBOARD 7.5X6 YLW CONV (MISCELLANEOUS) ×4 IMPLANT
PATTIES SURGICAL .25X.25 (GAUZE/BANDAGES/DRESSINGS) IMPLANT
PENCIL BUTTON HOLSTER BLD 10FT (ELECTRODE) ×2 IMPLANT
PLATE ACP 1.6 18 (Plate) IMPLANT
POSITIONER HEAD DONUT 9IN (MISCELLANEOUS) ×2 IMPLANT
SCREW ACP 3.5 X 13 S/D VARIA (Screw) ×4 IMPLANT
SCREW ACP 3.5X13 S/D VAR ANGLE (Screw) IMPLANT
SPONGE INTESTINAL PEANUT (DISPOSABLE) ×2 IMPLANT
SPONGE SURGIFOAM ABS GEL SZ50 (HEMOSTASIS) ×2 IMPLANT
SURGIFLO W/THROMBIN 8M KIT (HEMOSTASIS) IMPLANT
SUT BONE WAX W31G (SUTURE) ×2 IMPLANT
SUT MNCRL AB 3-0 PS2 27 (SUTURE) ×2 IMPLANT
SUT VIC AB 2-0 CT2 18 VCP726D (SUTURE) ×2 IMPLANT
SYR BULB IRRIG 60ML STRL (SYRINGE) ×2 IMPLANT
TAPE CLOTH 4X10 WHT NS (GAUZE/BANDAGES/DRESSINGS) ×2 IMPLANT
TOWEL GREEN STERILE (TOWEL DISPOSABLE) ×2 IMPLANT
TOWEL GREEN STERILE FF (TOWEL DISPOSABLE) ×2 IMPLANT
TUBING FEATHERFLOW (TUBING) ×2 IMPLANT

## 2023-10-08 NOTE — Discharge Instructions (Signed)
Orthopedic Surgery Discharge Instructions  Patient name: Linda Hopkins Procedure Performed: C5/6 anterior cervical discectomy and fusion Date of Surgery: 10/08/2023 Surgeon: Willia Craze, MD  Pre-operative Diagnosis: cervical radiculopathy Post-operative Diagnosis: same as above  Discharged to: home Discharge Condition: stable  Activity: You should wear your cervical collar for six weeks after surgery. It is okay to remove the cervical collar when taking a shower, but you should have it on at all other times. You should refrain from bending, lifting, or twisting with objects greater than ten pounds until three months after surgery. You are encouraged to walk as much as desired. You can perform household activities such as cleaning dishes, doing laundry, vacuuming, etc. as long as the ten-pound restriction is followed.  Incision Care: Your incision site has a dressing over it. That dressing should remain in place and dry at all times for a total of one week after surgery. After one week, you can remove the dressing. Underneath the dressing, you will find pieces of tape. You should leave these pieces of tape in place. They will fall off with time. Do not pick, rub, or scrub at them. Do not put cream or lotion over the surgical area. After one week and once the dressing is off, it is okay to let soap and water run over your incision. Again, do not pick, scrub, or rub at the pieces of tape when bathing. Do not submerge (e.g., take a bath, swim, go in a hot tub, etc.) until six weeks after surgery. There may be some bloody drainage from the incision into the dressing after surgery. This is normal. You do not need to replace the dressing. Continue to leave it in place for the one week as instructed above. Should the dressing become saturated with blood or drainage, please call the office for further instructions.   Medications: You have been prescribed tramadol. This is a narcotic pain medication and  should only be taken as prescribed. You should not drink alcohol or operate heavy machinery (including driving) while taking this medication. The tramadol can cause constipation as a side effect. For that reason, you have been prescribed senna and miralax. These are both laxatives. You do not need to take this medication if you develop diarrhea. Should you remain constipated even while taking these medications, please increase the dose of miralax to twice daily. Tylenol has been prescribed to be taken every 8 hours, which will give you additional pain relief. Robaxin is a muscle relaxer that has been prescribed to you for muscle spasm type pain. Take this medication as needed.   Do not take NSAIDs (ibuprofen, Aleve, Celebrex, naproxen, meloxicam, etc.) for the first 6 weeks after surgery as there is some evidence that their use may decrease the chances of successful fusion.   In order to set expectations for opioid prescriptions, you will only be prescribed opioids for a total of six weeks after surgery and, at two-weeks after surgery, your opioid prescription will start to tapered (decreased dosage and number of pills). If you have ongoing need for opioid medication six weeks after surgery, you will be referred to pain management. If you are already established with a provider that is giving you opioid medications, you should schedule an appointment with them for six weeks after surgery if you feel you are going to need another prescription. State law only allows for opioid prescriptions one week at a time. If you are running out of opioid medication near the end of the week,  please call the office during business hours before running out so I can send you another prescription.   You may resume any home blood thinners (aspirin, warfarin, lovenox, apixaban, plavix, xarelto, etc.) 72 hours after your surgery. Take these medications as they were previously prescribed.   Driving: You should not drive while  taking narcotic pain medications. You should also not drive if you feel you cannot turn your body enough to check your blind spots while in your cervical collar. In this case, you should wait six weeks to drive when your cervical collar will be discontinued. You should start getting back to driving slowly and you may want to try driving in a parking lot before doing anything more. Please be mindful that in some states it is illegal to drive with a cervical collar in place. You should check your local laws before driving and you may have to wait until the collar is discontinued before driving.  Diet: You are safe to resume your regular diet. A common complication after cervical spine surgery is trouble swallowing especially if the incision was made over the front part of your neck. This complication happens often and, the vast majority of the time, it is a temporary problem that gets better with time. The first few days after surgery, you should take more bites than normal to break the food into smaller pieces before swallowing. With time as the swallowing becomes easier, you can gradually get back to taking bites like you normally would.   Reasons to Call the Office After Surgery: You should feel free to call the office with any concerns or questions you have in the post-operative period, but you should definitely notify the office if you develop: -shortness of breath, chest pain, or trouble breathing -excessive bleeding, drainage, redness, or swelling around the surgical site -fevers, chills, or pain that is getting worse with each passing day -persistent nausea or vomiting -new weakness in any extremity, new or worsening numbness or tingling in any extremity -numbness in the groin, bowel or bladder incontinence -other concerns about your surgery  Follow Up Appointments: You should have an office appointment scheduled for approximately two weeks after surgery. If you do not remember when this  appointment is or do not already have it scheduled, please call the office to schedule.   Office Information:  -Willia Craze, MD -Phone number: [phone] -Address: [address]       [place], [place] [address]

## 2023-10-08 NOTE — Progress Notes (Signed)
Orthopedic Surgery Post-operative Progress Note  Assessment: Patient is a 42 y.o. female who is currently admitted after undergoing C5/6 ACDF   Plan: -Operative plans complete -Needs upright films when able -Drain: to be removed tomorrow -Out of bed as tolerated with soft collar -No bending/lifting/twisting greater than 10 pounds -OT evaluation and treat -Pain control -Regular diet -Ancef x2 post-operative doses -No antiplatelet or dvt chemoprophylaxis for 72 hours after surgery -Disposition: to floor from PACU  _________________________________________________________________________  Subjective: No acute events overnight. Pain adequately controlled. Neck feels sore. Radiating arm pain improved.   Objective:  General: no acute distress, appropriate affect Neurologic: alert, answering questions appropriately, following commands Respiratory: unlabored breathing on room air Neck: no hematoma appreciated, soft collar in place Skin: dressing clear/dry/intact  MSK (spine):  -Strength exam      Right  Left Grip strength                5/5  5/5 Interosseus   5/5   5/5 Wrist extension  5/5  5/5 Wrist flexion   5/5  5/5 Elbow flexion   5/5  5/5 Deltoid    5/5  5/5   -Sensory exam   Sensation intact to light touch in C5-T1 nerve distributions of bilateral upper extremities   Patient name: Linda Hopkins Patient MRN: [id number] Date: 10/08/23

## 2023-10-08 NOTE — Anesthesia Preprocedure Evaluation (Addendum)
Anesthesia Evaluation  Patient identified by MRN, date of birth, ID band Patient awake    Reviewed: Allergy & Precautions, NPO status , Patient's Chart, lab work & pertinent test results  Airway Mallampati: I  TM Distance: >3 FB Neck ROM: Full    Dental  (+) Edentulous Upper, Dental Advisory Given   Pulmonary former smoker   breath sounds clear to auscultation       Cardiovascular negative cardio ROS  Rhythm:Regular Rate:Normal     Neuro/Psych  Neuromuscular disease  negative psych ROS   GI/Hepatic negative GI ROS, Neg liver ROS,,,  Endo/Other  negative endocrine ROS    Renal/GU negative Renal ROS     Musculoskeletal  (+) Arthritis ,    Abdominal   Peds  Hematology negative hematology ROS (+)   Anesthesia Other Findings   Reproductive/Obstetrics                             Anesthesia Physical Anesthesia Plan  ASA: 2  Anesthesia Plan: General   Post-op Pain Management: Tylenol PO (pre-op)*   Induction: Intravenous  PONV Risk Score and Plan: 4 or greater and Ondansetron, Dexamethasone, Midazolam and Scopolamine patch - Pre-op  Airway Management Planned: Oral ETT and Video Laryngoscope Planned  Additional Equipment: None  Intra-op Plan:   Post-operative Plan: Extubation in OR  Informed Consent: I have reviewed the patients History and Physical, chart, labs and discussed the procedure including the risks, benefits and alternatives for the proposed anesthesia with the patient or authorized representative who has indicated his/her understanding and acceptance.     Dental advisory given  Plan Discussed with: CRNA  Anesthesia Plan Comments:        Anesthesia Quick Evaluation

## 2023-10-08 NOTE — Anesthesia Postprocedure Evaluation (Signed)
Anesthesia Post Note  Patient: Linda Hopkins  Procedure(s) Performed: C5-6 ANTERIOR CERVICAL DECOMPRESSION/DISCECTOMY FUSION 1 LEVEL     Patient location during evaluation: PACU Anesthesia Type: General Level of consciousness: awake and alert Pain management: pain level controlled Vital Signs Assessment: post-procedure vital signs reviewed and stable Respiratory status: spontaneous breathing, nonlabored ventilation, respiratory function stable and patient connected to nasal cannula oxygen Cardiovascular status: blood pressure returned to baseline and stable Postop Assessment: no apparent nausea or vomiting Anesthetic complications: no  No notable events documented.  Last Vitals:  Vitals:   10/08/23 1130 10/08/23 1151  BP: 107/78 115/85  Pulse: 83 70  Resp: 12 20  Temp:  36.9 C  SpO2: 98% 100%    Last Pain:  Vitals:   10/08/23 1324  PainSc: 7                  Shelton Silvas

## 2023-10-08 NOTE — Transfer of Care (Signed)
Immediate Anesthesia Transfer of Care Note  Patient: Linda Hopkins  Procedure(s) Performed: C5-6 ANTERIOR CERVICAL DECOMPRESSION/DISCECTOMY FUSION 1 LEVEL  Patient Location: PACU  Anesthesia Type:General  Level of Consciousness: awake, alert , and oriented  Airway & Oxygen Therapy: Patient Spontanous Breathing and Patient connected to face mask oxygen  Post-op Assessment: Report given to RN and Post -op Vital signs reviewed and stable  Post vital signs: Reviewed and stable  Last Vitals:  Vitals Value Taken Time  BP 110/77 10/08/23 1047  Temp 36.6 C 10/08/23 1047  Pulse 100 10/08/23 1051  Resp 13 10/08/23 1051  SpO2 99 % 10/08/23 1051  Vitals shown include unfiled device data.  Last Pain:  Vitals:   10/08/23 0639  PainSc: 0-No pain         Complications: No notable events documented.

## 2023-10-08 NOTE — H&P (Signed)
Orthopedic Spine Surgery H&P Note  Assessment: Patient is a 42 y.o. female with cervical radiculopathy   Plan: -Out of bed as tolerated, activity as tolerated, no brace -Covered the risks of surgery one more time with the patient and patient elected to proceed with planned surgery -Written consent verified -Hold anticoagulation in anticipation of surgery -Ancef and TXA on all to OR -NPO for procedure -Site marked -To OR when ready  The patient has cervical radiculopathy, which was initially treated with non-operative measures. The patient's symptoms failed to improve with conservative treatments, so operative management was discussed in the form of C5/6 anterior cervical discectomy and fusion. The risks, including but not limited to pseudarthrosis, dysphagia, hematoma, airway compromise, recurrent laryngeal nerve injury, esophageal perforation, durotomy, spinal cord injury, nerve root injury, persistent pain, adjacent segment disease, infection, bleeding, hardware failure, vascular injury, heart attack, death, stroke, fracture, and need for additional procedures were discussed with the patient. The benefit of surgery would be improvement in the patient's radiating arm pain. Explained that the patient may not get full relief of their pain with this surgery, especially any neck pain. The alternatives to surgical management would be continued monitoring, physical therapy, over-the-counter pain medications, injections, traction, injections, and activity modification. All the patient's questions were answered to her satisfaction. After this discussion, the patient expressed understanding and elected to proceed with surgical intervention.    ___________________________________________________________________________  Chief Complaint: neck pain that radiates into bilateral upper extremities  History: Patient is 42 y.o. female who has been previously seen in the office for neck pain that radiated into  her arms. She felt the pain going into her shoulders, lateral arms, and into her dorsal forearms. Work up was consistent with cervical radiculopathy. Her symptoms failed to improve with conservative treatment so operative management was discussed at the last office visit. The patient presents today with no changes in their symptoms since the last office visit. See previous office note for further details.    Review of systems: General: denies fevers and chills, myalgias Neurologic: denies recent changes in vision, slurred speech Abdomen: denies nausea, vomiting, hematemesis Respiratory: denies cough, shortness of breath  Past medical history: Migraines   Allergies: hydrocodone   Past surgical history:  Cesarean section Hysterectomy Dilation and curettage   Social history: Quit use of nicotine product (smoking, vaping, patches, smokeless) Alcohol use: denies Denies recreational drug use  Family history: -reviewed and not pertinent to cervical radiculopathy   Physical Exam:  BMI of 26.6  General: no acute distress, appears stated age Neurologic: alert, answering questions appropriately, following commands Cardiovascular: regular rate, no cyanosis Respiratory: unlabored breathing on room air, symmetric chest rise Psychiatric: appropriate affect, normal cadence to speech   MSK (spine):  -Strength exam      Left  Right Grip strength                5/5  5/5 Interosseus   5/5   5/5 Wrist extension  5/5  5/5 Wrist flexion   5/5  5/5 Elbow extension  5/5  5/5 Elbow flexion   5/5  5/5 Deltoid    5/5  5/5   -Sensory exam   Sensation intact to light touch in C5-T1 nerve distributions of bilateral upper extremities   Patient name: Linda Hopkins Patient MRN: [id number] Date: 10/08/23

## 2023-10-08 NOTE — Op Note (Signed)
Orthopedic Spine Surgery Operative Report  Procedure: C5/6 anterior cervical discectomy and fusion C5/6 placement of structural allograft interbody spacer C5 and C6 anterior plate instrumentation Placement and removal of Gardner-Wells tongs for disc space distraction Intra-operative use of microscope  Modifier: none  Date of procedure: 10/08/2023  Patient name: Linda Hopkins MRN: [id number] DOB: 1981/05/16  Surgeon: Willia Craze, MD Assistant: None Pre-operative diagnosis: Cervical radiculopathy, cervical foraminal stenosis Post-operative diagnosis: same as above Findings: C5/6 disc height loss  Specimens: none Anesthesia: general EBL: 30cc Complications: none Pre-incision antibiotic: ancef Pre-incision decadron: 10mg  TXA was given prior to incision as well  Implants:  Implant Name Type Inv. Item Serial No. Manufacturer Lot No. LRB No. Used Action  [employer]OG[employer]FT [employer] CC - [id number] Bone Implant [employer] [id number] [employer]  N/A 1 Implanted  PLATE ACP 1.6 18 - [id number] Plate PLATE ACP 1.6 18  [employer]  N/A 1 Implanted  SCREW ACP 3.5 X 13 S/D VARIA - [id number] Screw SCREW ACP 3.5 X 13 S/D VARIA  [employer]  N/A 4 Implanted      Indication for procedure: Patient is a 42 y.o. female who presented to the office with symptoms consistent with cervical radiculopathy. The patient had tried conservative treatments that did not provide any lasting relief. As result, operative management was discussed. The plan was for a C5/6 ACDF. The risks, including but not limited to pseudarthrosis, dysphagia, hematoma, airway compromise, recurrent laryngeal nerve injury, esophageal perforation, durotomy, spinal cord injury, nerve root injury, persistent pain, adjacent segment disease, infection, bleeding, hardware failure, vascular injury, heart attack, death, stroke, fracture, and need for additional procedures were discussed with the patient. The benefit of  surgery would be relief of the patient's radiating arm pain. Explained that the patient may not get full relief of her pain with this surgery, especially any neck pain. The alternatives to surgical management would be continued monitoring, physical therapy, over-the-counter pain medications, injections, traction, and activity modification. All the patient's questions were answered to her satisfaction. After this discussion, the patient expressed understanding and elected to proceed with surgical intervention.  Procedure Description: The patient was met in the pre-operative holding area. The patient's identity and consent were verified. The operative site was marked. The patient's remaining questions about the surgery were answered. The patient was brought back to the operating room. General anesthesia was induced and an endotracheal tube was placed by the anesthesia staff. The patient was transferred to the flat top [place] table in the supine position. All bony prominences were well padded. A bump was placed underneath the patient's shoulders to extend the neck slightly.  Neuromonitoring leads were attached to the patient by the neuromonitoring technologist. Gardner-Wells tongs were attached to the patient's head about 1cm above the pinna in line with the external auditory meatus. Baseline neuromonitoring signals were obtained. 15 pounds of weight was then attached to the tongs. There was no change in the neuromonitoring signals after the weight was attached. The patient's shoulders were than taped to the bed to improve the fluoroscopic visualization during the procedure. The surgical area was cleansed with alcohol. Fluoroscopy was then brought in to check rotation on the AP image and to mark the levels on the lateral image. The patient's skin was then prepped and draped in a standard, sterile fashion. A time out was performed that identified the patient, the procedure, and the operative levels. All team members  agreed with what was stated in the time out.  A left-sided transverse incision was made in the middle of the previously marked levels. Incision was taken sharply down through the skin and dermis. Electrocautery was used to dissect down to the level of the platysma. A Metzenbaum scissors was used to elevate flaps both cranially and caudally above the platysma. A small opening was made in the platysma with the Metzenbaum scissors. The scissors were then placed under the platysma and electrocautery was used on top of the scissors to split the platysma. Blunt dissection was carried out medial to the sternocleidomastoid to identify the omohyoid. The omohyoid was then dissected over its lateral aspect with the Metzenbaum scissors. An appendiceal retractor was placed around the carotid sheath to retract it laterally and a cloward retractor was placed medially to retractor the esophagus and trachea medially. The interval between these structures was then bluntly dissected with the Metzenbaum scissors. At this point, the prevertebral fascia was visible within the wound. A kittner was used to dissect the prevertebral fascia off the vertebral bodies and discs. A metallic sucker tip was placed over the disc thought to be the correct level. A lateral fluoroscopic shot was then used to confirm the level. Once the correct level was identified, electrocautery was used to mark the disc space. The retractors that were previously placed were then put back into the wound.   The operative microscope was brought in to improve lighting and visualization. Electrocautery was used to elevate the longus coli off the bone on each side. Shadowline retractor blades were then placed under the longus coli on each side and the self-retainer was attached to these retractor blades. A long handle fifteen blade was then used to incise the disc space along the endplates and longitudinally to connect these end plate incisions to create a rectangular  incision in the disc. A pituitary was used to remove the incised disc material. A 2 kerrison was used to remove the anterior osteophyte and square the inferior endplate of the cranial vertebra. A combination of straight and curved curettes were used to remove further disc material and the cartilaginous aspect of the endplates. This was done until both the inferior and superior endplates had been prepared from the uncus to uncus and from ventral vertebral body to the posterior osteophytes. At this point, a burr was used to remove the posterior vertebral osteophytes. Once the PLL was visualized within the wound, a nerve hook was used to develop the plane between the PLL and the dura. While lifting up the PLL with the nerve hook, a 1 kerrison was placed under the PLL and used to remove the PLL. Once some of the PLL had been removed, a combination of 1 and 2 kerrisons were used to continue removing the PLL until it had been completely removed. A curved curette was then placed into the foramen and pointed ventrally. It was used to remove soft tissue and the deep aspect of the uncus. A nerve hook was then easily passed into the foramen. The same process was repeated for the other foramen. Lateral fluoroscopic imaging was used to guide the insertion of trials into the disc space in a serial fashion, starting with the smallest trial. Once a good fit was obtained, the trial was left in and neuromonitoring signals were checked. There were no changes from baseline. AP and lateral fluoroscopic images were obtained and confirmed satisfactory position of the trial. A 6mm structural allograft interbody spacer was selected for use as the final implant.  The interbody implant was then  placed into the prepared disc space and lightly malleted into place. Neuromonitoring signals were checked again and no changes from baseline were noted. AP and lateral fluoroscopic images was obtained and confirmed satisfactory position of the final  implant.  The microscope was then moved away from the operative field and the retractor blades were removed from the wound. Anesthesia staff removed the weight from the Gardner-Wells tongs and a repeat neuromonitoring check showed no change from baseline. A cloward was then used to retract the esophagus and trachea away from the ventral vertebral bodies and another was used to retract the carotid and sternocleidomastoid laterally. An 18mm anterior plate was then selected and placed over the ventral aspects of the vertebral bodies. No soft tissue structures were underneath the plate. An awl was put into the wound and into one of the screw holes in the plate. It was aimed slightly medially. A 13mm screw was then inserted into the created hole. The same steps were repeated with the awl and screw to place the same sized screws in the remaining holes within the plate. Again, the wound was checked and no soft tissue structures were seen underneath the plate.   Final AP and lateral fluoroscopic films were taken and confirmed satisfactory position of the plate and allograft interbody implant. The screws were final tightened. Hemostasis was achieved. A penrose drain was placed into the wound. The platysma was reapproximated using 2-0 vicryl. Neuromonitoring was checked and there were no changes from baseline. Neuromonitoring was discontinued at this time. The deep dermal layer was closed with 2-0 vicryl. The skin was closed with a 3-0 monocryl. All counts were correct at the end of the procedure. Benzoine and steri strips were used over the incision area. The wound was dressed with 4x4 gauze and paper tape. The Gardner-Wells tongs were removed and bacitracin was placed over the skin puncture sites. The patient was awakened from anesthesia and transferred to a bed where a soft cervical collar was applied. The patient was brought back to the post-anesthesia care unit in stable condition by the anesthesia staff.    Post-operative plan: The patient will recover in the post-anesthesia care unit and then go to the floor. The patient will receive two post-operative doses of ancef. The patient will be out of bed as tolerated in a soft cervical collar. The patient will work with physical therapy. The drain will be removed on post-operative day one. The patient will likely discharge to home tomorrow morning.    Willia Craze, MD Orthopedic Surgeon

## 2023-10-08 NOTE — Discharge Summary (Signed)
Orthopedic Surgery Discharge Summary  Patient name: Linda Hopkins Patient MRN: [id number] Admit today: 10/08/2023 Discharge date: 10/09/2023  Attending physician: Willia Craze, MD Final diagnosis: cervical radiculopathy  Findings: degenerative disc at C5/6  Hospital course: Patient is a 42 y.o. female who was admitted after undergoing C5/6 anterior cervical discectomy and fusion. The patient had significant pain immediately after surgery, but pain eventually was controlled with a multimodal regimen including oxycodone. The patient worked with physical therapy who recommended discharge to home. The patient was tolerating an oral diet without issue and was voiding spontaneously after surgery. The patient's vitals were stable on the day of discharge. The patient's drains were removed on the day of discharge. The patient was medically ready for discharge and was discharge to home on post-operative day one.  Instructions:   Orthopedic Surgery Discharge Instructions  Patient name: Linda Hopkins Procedure Performed: C5/6 anterior cervical discectomy and fusion Date of Surgery: 10/08/2023 Surgeon: Willia Craze, MD  Pre-operative Diagnosis: cervical radiculopathy Post-operative Diagnosis: same as above  Discharged to: home Discharge Condition: stable  Activity: You should wear your cervical collar for six weeks after surgery. It is okay to remove the cervical collar when taking a shower, but you should have it on at all other times. You should refrain from bending, lifting, or twisting with objects greater than ten pounds until three months after surgery. You are encouraged to walk as much as desired. You can perform household activities such as cleaning dishes, doing laundry, vacuuming, etc. as long as the ten-pound restriction is followed.  Incision Care: Your incision site has a dressing over it. That dressing should remain in place and dry at all times for a total of one week after  surgery. After one week, you can remove the dressing. Underneath the dressing, you will find pieces of tape. You should leave these pieces of tape in place. They will fall off with time. Do not pick, rub, or scrub at them. Do not put cream or lotion over the surgical area. After one week and once the dressing is off, it is okay to let soap and water run over your incision. Again, do not pick, scrub, or rub at the pieces of tape when bathing. Do not submerge (e.g., take a bath, swim, go in a hot tub, etc.) until six weeks after surgery. There may be some bloody drainage from the incision into the dressing after surgery. This is normal. You do not need to replace the dressing. Continue to leave it in place for the one week as instructed above. Should the dressing become saturated with blood or drainage, please call the office for further instructions.   Medications: You have been prescribed tramadol. This is a narcotic pain medication and should only be taken as prescribed. You should not drink alcohol or operate heavy machinery (including driving) while taking this medication. The tramadol can cause constipation as a side effect. For that reason, you have been prescribed senna and miralax. These are both laxatives. You do not need to take this medication if you develop diarrhea. Should you remain constipated even while taking these medications, please increase the dose of miralax to twice daily. Tylenol has been prescribed to be taken every 8 hours, which will give you additional pain relief. Robaxin is a muscle relaxer that has been prescribed to you for muscle spasm type pain. Take this medication as needed.   Do not take NSAIDs (ibuprofen, Aleve, Celebrex, naproxen, meloxicam, etc.) for the first 6 weeks  after surgery as there is some evidence that their use may decrease the chances of successful fusion.   In order to set expectations for opioid prescriptions, you will only be prescribed opioids for a total  of six weeks after surgery and, at two-weeks after surgery, your opioid prescription will start to tapered (decreased dosage and number of pills). If you have ongoing need for opioid medication six weeks after surgery, you will be referred to pain management. If you are already established with a provider that is giving you opioid medications, you should schedule an appointment with them for six weeks after surgery if you feel you are going to need another prescription. State law only allows for opioid prescriptions one week at a time. If you are running out of opioid medication near the end of the week, please call the office during business hours before running out so I can send you another prescription.   You may resume any home blood thinners (aspirin, warfarin, lovenox, apixaban, plavix, xarelto, etc.) 72 hours after your surgery. Take these medications as they were previously prescribed.   Driving: You should not drive while taking narcotic pain medications. You should also not drive if you feel you cannot turn your body enough to check your blind spots while in your cervical collar. In this case, you should wait six weeks to drive when your cervical collar will be discontinued. You should start getting back to driving slowly and you may want to try driving in a parking lot before doing anything more. Please be mindful that in some states it is illegal to drive with a cervical collar in place. You should check your local laws before driving and you may have to wait until the collar is discontinued before driving.  Diet: You are safe to resume your regular diet. A common complication after cervical spine surgery is trouble swallowing especially if the incision was made over the front part of your neck. This complication happens often and, the vast majority of the time, it is a temporary problem that gets better with time. The first few days after surgery, you should take more bites than normal to break  the food into smaller pieces before swallowing. With time as the swallowing becomes easier, you can gradually get back to taking bites like you normally would.   Reasons to Call the Office After Surgery: You should feel free to call the office with any concerns or questions you have in the post-operative period, but you should definitely notify the office if you develop: -shortness of breath, chest pain, or trouble breathing -excessive bleeding, drainage, redness, or swelling around the surgical site -fevers, chills, or pain that is getting worse with each passing day -persistent nausea or vomiting -new weakness in any extremity, new or worsening numbness or tingling in any extremity -numbness in the groin, bowel or bladder incontinence -other concerns about your surgery  Follow Up Appointments: You should have an office appointment scheduled for approximately two weeks after surgery. If you do not remember when this appointment is or do not already have it scheduled, please call the office to schedule.   Office Information:  -Willia Craze, MD -Phone number: [phone] -Address: [address]       [place], [place] [address]

## 2023-10-08 NOTE — Plan of Care (Signed)
  Problem: Education: Goal: Knowledge of General Education information will improve Description: Including pain rating scale, medication(s)/side effects and non-pharmacologic comfort measures Outcome: Progressing   Problem: Health Behavior/Discharge Planning: Goal: Ability to manage health-related needs will improve Outcome: Progressing   Problem: Clinical Measurements: Goal: Ability to maintain clinical measurements within normal limits will improve Outcome: Progressing Goal: Will remain free from infection Outcome: Progressing Goal: Diagnostic test results will improve Outcome: Progressing Goal: Respiratory complications will improve Outcome: Progressing Goal: Cardiovascular complication will be avoided Outcome: Progressing   Problem: Activity: Goal: Risk for activity intolerance will decrease Outcome: Progressing   Problem: Nutrition: Goal: Adequate nutrition will be maintained Outcome: Progressing   Problem: Elimination: Goal: Will not experience complications related to bowel motility Outcome: Progressing Goal: Will not experience complications related to urinary retention Outcome: Progressing   Problem: Pain Management: Goal: General experience of comfort will improve Outcome: Progressing   Problem: Education: Goal: Ability to verbalize activity precautions or restrictions will improve Outcome: Progressing Goal: Knowledge of the prescribed therapeutic regimen will improve Outcome: Progressing Goal: Understanding of discharge needs will improve Outcome: Progressing   Problem: Safety: Goal: Ability to remain free from injury will improve Outcome: Progressing

## 2023-10-09 ENCOUNTER — Other Ambulatory Visit (HOSPITAL_COMMUNITY): Payer: Self-pay

## 2023-10-09 DIAGNOSIS — M50122 Cervical disc disorder at C5-C6 level with radiculopathy: Secondary | ICD-10-CM | POA: Diagnosis not present

## 2023-10-09 DIAGNOSIS — Z87891 Personal history of nicotine dependence: Secondary | ICD-10-CM | POA: Diagnosis not present

## 2023-10-09 DIAGNOSIS — M4802 Spinal stenosis, cervical region: Secondary | ICD-10-CM | POA: Diagnosis not present

## 2023-10-09 MED ORDER — ACETAMINOPHEN 500 MG PO TABS
1000.0000 mg | ORAL_TABLET | Freq: Three times a day (TID) | ORAL | 0 refills | Status: AC
  Filled 2023-10-09: qty 126, 21d supply, fill #0

## 2023-10-09 MED ORDER — ACETAMINOPHEN 325 MG PO TABS
650.0000 mg | ORAL_TABLET | Freq: Four times a day (QID) | ORAL | Status: DC | PRN
  Administered 2023-10-09: 650 mg via ORAL
  Filled 2023-10-09: qty 2

## 2023-10-09 MED ORDER — TRAMADOL HCL 50 MG PO TABS
50.0000 mg | ORAL_TABLET | Freq: Four times a day (QID) | ORAL | 0 refills | Status: AC | PRN
  Filled 2023-10-09: qty 25, 7d supply, fill #0

## 2023-10-09 MED ORDER — POLYETHYLENE GLYCOL 3350 17 GM/SCOOP PO POWD
17.0000 g | Freq: Every day | ORAL | 0 refills | Status: AC
  Filled 2023-10-09: qty 238, 14d supply, fill #0

## 2023-10-09 MED ORDER — METHOCARBAMOL 500 MG PO TABS
500.0000 mg | ORAL_TABLET | Freq: Four times a day (QID) | ORAL | 0 refills | Status: AC
  Filled 2023-10-09: qty 20, 5d supply, fill #0

## 2023-10-09 MED ORDER — SENNA 8.6 MG PO TABS
1.0000 | ORAL_TABLET | Freq: Two times a day (BID) | ORAL | 0 refills | Status: AC
  Filled 2023-10-09: qty 28, 14d supply, fill #0

## 2023-10-09 NOTE — Plan of Care (Signed)
Pt doing well. Pt and husband given D/C instructions with verbal understanding. Rx's were given to the Pt at D/C by [facility] pharmacy. Pt's incision is clean and dry with no sign of infection. Pt's IV was removed prior to D/C. Pt D/C'd home via wheelchair per MD order. Pt is stable @ D/C and has no other needs at this time. Pt is stable @ D/C and has no other needs at this time. Rema Fendt, RN

## 2023-10-09 NOTE — Progress Notes (Signed)
Orthopedic Surgery Post-operative Progress Note  Assessment: Patient is a 42 y.o. female who is currently admitted after undergoing C5/6 ACDF   Plan: -Operative plans complete -Drain: removed this morning -Out of bed as tolerated with soft collar -No bending/lifting/twisting greater than 10 pounds -OT evaluation and treat -Pain control -Regular diet -Ancef x2 post-operative doses -No antiplatelet or dvt chemoprophylaxis for 72 hours after surgery -Anticipate discharge to home today  _________________________________________________________________________  Subjective: No acute events overnight. Pain controlled with tylenol. Not having any radiating arm pain. Denies paresthesias and numbness.   Objective:  General: no acute distress, appropriate affect Neurologic: alert, answering questions appropriately, following commands Respiratory: unlabored breathing on room air Neck: no hematoma appreciated, soft collar in place Skin: dressing clear/dry/intact  MSK (spine):  -Strength exam      Right  Left Grip strength                5/5  5/5 Interosseus   5/5   5/5 Wrist extension  5/5  5/5 Wrist flexion   5/5  5/5 Elbow flexion   5/5  5/5 Deltoid    5/5  5/5   -Sensory exam   Sensation intact to light touch in C5-T1 nerve distributions of bilateral upper extremities   Patient name: Linda Hopkins Patient MRN: [id number] Date: 10/09/23

## 2023-10-09 NOTE — Evaluation (Signed)
Occupational Therapy Evaluation Patient Details Name: Linda Hopkins MRN: [id number] DOB: Apr 29, 1981 Today's Date: 10/09/2023   History of Present Illness Linda Hopkins is a 42 yo female who underwent C5/6 anterior cervical discectomy and fusion 12/2. PMHx: none on file.   Clinical Impression   Linda Hopkins was evaluated s/p the above spine surgery. She is indep and lives with family at baseline. Upon evaluation pt was limited by mild surgical pain, cervical precautions and compensatory techniques. Overall she demonstrated mod I ability to complete all ADLs and mobility without need for DME. Provided cues and education on spinal precautions and compensatory techniques throughout, handout provided and pt demonstrated great recall during ADLs and mobility. Pt does not require further acute OT services. Recommend d/c home with support of family.         If plan is discharge home, recommend the following: Assist for transportation;Assistance with cooking/housework    Functional Status Assessment  Patient has had a recent decline in their functional status and demonstrates the ability to make significant improvements in function in a reasonable and predictable amount of time.  Equipment Recommendations  None recommended by OT       Precautions / Restrictions Precautions Precautions: Fall;Cervical Precaution Booklet Issued: Yes (comment) Required Braces or Orthoses: Cervical Brace Cervical Brace: Soft collar;At all times Restrictions Weight Bearing Restrictions: No      Mobility Bed Mobility Overal bed mobility: Needs Assistance Bed Mobility: Rolling, Sidelying to Sit, Sit to Sidelying Rolling: Modified independent (Device/Increase time) Sidelying to sit: Modified independent (Device/Increase time)     Sit to sidelying: Modified independent (Device/Increase time)      Transfers Overall transfer level: Needs assistance Equipment used: None Transfers: Sit to/from Stand Sit to  Stand: Independent                  Balance Overall balance assessment: Independent, No apparent balance deficits (not formally assessed)         ADL either performed or assessed with clinical judgement   ADL Overall ADL's : Modified independent;At baseline         General ADL Comments: after review of cervical precatuions, pt demonstrated mod I ability to complete all ADLs and mobility without DME.     Vision Baseline Vision/History: 1 Wears glasses Vision Assessment?: No apparent visual deficits     Perception Perception: Within Functional Limits       Praxis Praxis: WFL       Pertinent Vitals/Pain Pain Assessment Pain Assessment: Faces Faces Pain Scale: Hurts a little bit Pain Location: neck Pain Descriptors / Indicators: Discomfort Pain Intervention(s): Monitored during session     Extremity/Trunk Assessment Upper Extremity Assessment Upper Extremity Assessment: Overall WFL for tasks assessed   Lower Extremity Assessment Lower Extremity Assessment: Overall WFL for tasks assessed   Cervical / Trunk Assessment Cervical / Trunk Assessment: Neck Surgery   Communication Communication Communication: No apparent difficulties   Cognition Arousal: Alert Behavior During Therapy: WFL for tasks assessed/performed Overall Cognitive Status: Within Functional Limits for tasks assessed               General Comments  VSS, family present     Home Living Family/patient expects to be discharged to:: Private residence Living Arrangements: Spouse/significant other;Children;Other relatives Available Help at Discharge: Family;Available 24 hours/day Type of Home: House Home Access: Stairs to enter     Home Layout: Multi-level Alternate Level Stairs-Number of Steps: 5-7 in multipe areas Alternate Level Stairs-Rails: Right;Left Bathroom Shower/Tub: Walk-in shower  Bathroom Toilet: Standard     Home Equipment: Cane - single point          Prior  Functioning/Environment Prior Level of Function : Needs assist             Mobility Comments: no AD ADLs Comments: indep        OT Problem List: Decreased safety awareness;Decreased knowledge of precautions         OT Goals(Current goals can be found in the care plan section) Acute Rehab OT Goals Patient Stated Goal: home OT Goal Formulation: With patient Time For Goal Achievement: 10/09/23 Potential to Achieve Goals: Good   AM-PAC OT "6 Clicks" Daily Activity     Outcome Measure Help from another person eating meals?: None Help from another person taking care of personal grooming?: None Help from another person toileting, which includes using toliet, bedpan, or urinal?: None Help from another person bathing (including washing, rinsing, drying)?: None Help from another person to put on and taking off regular upper body clothing?: None Help from another person to put on and taking off regular lower body clothing?: None 6 Click Score: 24   End of Session Equipment Utilized During Treatment: Cervical collar  Activity Tolerance:   Patient left:    OT Visit Diagnosis: Pain                Time: 7829-5621 OT Time Calculation (min): 15 min Charges:  OT General Charges $OT Visit: 1 Visit OT Evaluation $OT Eval Low Complexity: 1 Low  Derenda Mis, OTR/L Acute Rehabilitation Services Office [phone] Secure Chat Communication Preferred   Donia Pounds 10/09/2023, 9:23 AM

## 2023-10-10 ENCOUNTER — Encounter (HOSPITAL_COMMUNITY): Payer: Self-pay | Admitting: Orthopedic Surgery

## 2023-10-22 ENCOUNTER — Other Ambulatory Visit (INDEPENDENT_AMBULATORY_CARE_PROVIDER_SITE_OTHER): Payer: BC Managed Care – PPO

## 2023-10-22 ENCOUNTER — Ambulatory Visit (INDEPENDENT_AMBULATORY_CARE_PROVIDER_SITE_OTHER): Payer: BC Managed Care – PPO | Admitting: Orthopedic Surgery

## 2023-10-22 DIAGNOSIS — Z981 Arthrodesis status: Secondary | ICD-10-CM | POA: Diagnosis not present

## 2023-10-22 NOTE — Progress Notes (Signed)
Orthopedic Surgery Post-operative Office Visit  Procedure: C5/6 ACDF Date of Surgery: 10/08/2023 (~2 weeks post-op)  Assessment: Patient is a 42 y.o. female who is doing well after surgery   Plan: -Operative plans complete -Out of bed as tolerated, soft collar -Will discontinue collar at our next visit -Okay to let soap/water run over incision but do not submerge -No bending/lifting/twisting greater than 10 pounds -Pain management: weaning oxycodone -Return to office in 4 weeks, x-rays needed at next visit: AP/lateral cervical  ___________________________________________________________________________   Subjective: Patient has been doing well since surgery.  Her neck pain has been getting better.  She is only taking Tylenol for pain control.  She is not having any pain radiating into her upper extremities.  She has not noticed any redness or drainage around her incision.  Objective:  General: no acute distress, appropriate affect Neurologic: alert, answering questions appropriately, following commands Respiratory: unlabored breathing on room air Skin: incision is well-approximated with no erythema, induration, active/expressible drainage MSK (spine):  -Strength exam      Left  Right Grip strength                5/5  5/5 Interosseus   5/5   5/5 Wrist extension  5/5  5/5 Wrist flexion   5/5  5/5 Elbow flexion   5/5  5/5 Deltoid    5/5  5/5   -Sensory exam    Sensation intact to light touch in C5-T1 nerve distributions of bilateral upper extremities   Imaging: X-rays of the cervical spine taken 10/22/2023 were independently reviewed and interpreted, showing anterior cervical plate from G2/X5.  No lucency seen around the screws. There is an allograft interbody spacer in the former C5/6 disc space.  No lucency seen around the screws.  No fracture or dislocation seen.    Patient name: Linda Hopkins Patient MRN: [id number] Date of visit: 10/22/23

## 2023-10-24 ENCOUNTER — Encounter: Payer: Self-pay | Admitting: Orthopedic Surgery

## 2023-11-19 ENCOUNTER — Other Ambulatory Visit (INDEPENDENT_AMBULATORY_CARE_PROVIDER_SITE_OTHER): Payer: BC Managed Care – PPO

## 2023-11-19 ENCOUNTER — Ambulatory Visit (INDEPENDENT_AMBULATORY_CARE_PROVIDER_SITE_OTHER): Payer: BC Managed Care – PPO | Admitting: Orthopedic Surgery

## 2023-11-19 DIAGNOSIS — Z981 Arthrodesis status: Secondary | ICD-10-CM | POA: Diagnosis not present

## 2023-11-19 NOTE — Progress Notes (Signed)
 Orthopedic Surgery Post-operative Office Visit   Procedure: C5/6 ACDF Date of Surgery: 10/08/2023 (~6 weeks post-op)   Assessment: Patient is a 43 y.o. female who is doing well after surgery     Plan: -Operative plans complete -Out of bed as tolerated, discontinue collar at this point -Okay to submerge wound at this time -No bending/lifting/twisting greater than 10 pounds -Pain management: OTC medications -Return to office in 6 weeks, x-rays needed at next visit: AP/lateral/flex/ex cervical   ___________________________________________________________________________     Subjective: Patient has been doing well since surgery. Not having any radiating arm pain. No neck pain. No redness or drainage noticed. Patient is happy with surgical outcome at this point.    Objective:   General: no acute distress, appropriate affect Neurologic: alert, answering questions appropriately, following commands Respiratory: unlabored breathing on room air Skin: incision is well healed with no erythema, induration, active/expressible drainage  MSK (spine):   -Strength exam                                                   Left                  Right Grip strength                5/5                  5/5 Interosseus                  5/5                  5/5 Wrist extension            5/5                  5/5 Wrist flexion                 5/5                  5/5 Elbow flexion                5/5                  5/5 Deltoid                          5/5                  5/5     -Sensory exam                           Sensation intact to light touch in C5-T1 nerve distributions of bilateral upper extremities     Imaging: X-rays of the cervical spine taken 11/19/2023 were independently reviewed and interpreted, showing allograft interbody device at C5/6 that is in appropriate position. Anterior cervical instrumentation from C5 to C6. No lucency seen around the screws. No fracture or dislocation  seen.     Patient name: Linda Hopkins Patient MRN: [id number] Date of visit: 11/19/23

## 2023-12-22 ENCOUNTER — Telehealth: Payer: BC Managed Care – PPO | Admitting: Physician Assistant

## 2023-12-22 DIAGNOSIS — R1084 Generalized abdominal pain: Secondary | ICD-10-CM

## 2023-12-22 NOTE — Progress Notes (Signed)

## 2023-12-31 ENCOUNTER — Ambulatory Visit (INDEPENDENT_AMBULATORY_CARE_PROVIDER_SITE_OTHER): Payer: BC Managed Care – PPO | Admitting: Orthopedic Surgery

## 2023-12-31 ENCOUNTER — Other Ambulatory Visit (INDEPENDENT_AMBULATORY_CARE_PROVIDER_SITE_OTHER): Payer: Self-pay

## 2023-12-31 DIAGNOSIS — Z981 Arthrodesis status: Secondary | ICD-10-CM | POA: Diagnosis not present

## 2023-12-31 DIAGNOSIS — M25512 Pain in left shoulder: Secondary | ICD-10-CM

## 2023-12-31 NOTE — Progress Notes (Signed)
 Orthopedic Surgery Post-operative Office Visit   Procedure: C5/6 ACDF Date of Surgery: 10/08/2023 (~3 months post-op)   Assessment: Patient is a 43 y.o. female who is doing well after surgery     Plan: -Operative plans complete -No spine specific precautions -For her shoulder pain, recommended PT. Referral provided to her -Pain management: OTC medications -Return to office in 3 months, x-rays needed at next visit: AP/lateral/flex/ex cervical   ___________________________________________________________________________     Subjective: Patient has been doing well since surgery. She is not having any radiating arm pain since surgery. She has developed left shoulder pain recently. She feels it in there area of the deltoid insertion. No right arm pain. There was no trauma or injury that preceded the onset of her pain. It is worse with overhead activity and she notes it when internally rotating to put on a bra. Denies paresthesias and numbness. Not having any significant neck pain.    Objective:   General: no acute distress, appropriate affect Neurologic: alert, answering questions appropriately, following commands Respiratory: unlabored breathing on room air Skin: incision is well healed    MSK (spine):   -Strength exam                                                   Left                  Right Grip strength                5/5                  5/5 Interosseus                  5/5                  5/5 Wrist extension            5/5                  5/5 Wrist flexion                 5/5                  5/5 Elbow flexion                5/5                  5/5 Deltoid                          5/5                  5/5     -Sensory exam                           Sensation intact to light touch in C5-T1 nerve distributions of bilateral upper extremities  Left shoulder exam: pain with jobe but no weakness, negative drop arm sign, pain with external rotation past 70 degrees,  negative belly press, no weakness with external rotation with arm at side     Imaging: X-rays of the cervical spine taken 12/31/2023 were independently reviewed and interpreted, showing anterior instrumentation at C5/6. No lucency seen around the screws. Interbody device at C5/6  appears in similar position to prior films. No translation seen on flexion/extension films. No fracture or dislocation seen.      Patient name: Linda Hopkins Patient MRN: [id number] Date of visit: 12/31/23

## 2024-01-08 NOTE — Therapy (Signed)
 OUTPATIENT PHYSICAL THERAPY SHOULDER EVALUATION   Patient Name: Linda Hopkins MRN: [id number] DOB:Dec 05, 1980, 43 y.o., female Today's Date: 01/09/2024  END OF SESSION:  PT End of Session - 01/09/24 0841     Visit Number 1    Authorization Type BC[employer]    PT Start Time 0845    PT Stop Time 0930    PT Time Calculation (min) 45 min             Past Medical History:  Diagnosis Date   Arthritis    oa hips   COVID 09/2020   congestion body aches x 7 days all symtposm reolved   Pelvic pain    Wears dentures    upper   Wears glasses    Past Surgical History:  Procedure Laterality Date   ANTERIOR CERVICAL DECOMP/DISCECTOMY FUSION N/A 10/08/2023   Procedure: C5-6 ANTERIOR CERVICAL DECOMPRESSION/DISCECTOMY FUSION 1 LEVEL;  Surgeon: London Sheer, MD;  Location: [facility] OR;  Service: Orthopedics;  Laterality: N/A;   CESAREAN SECTION     x 2   CYSTOSCOPY N/A 03/24/2021   Procedure: CYSTOSCOPY;  Surgeon: Lavina Hamman, MD;  Location: [facility];  Service: Gynecology;  Laterality: N/A;   DILATION AND CURETTAGE OF UTERUS  2004   LAPAROSCOPIC BILATERAL SALPINGECTOMY Bilateral 03/24/2021   Procedure: LAPAROSCOPIC BILATERAL SALPINGECTOMY;  Surgeon: Lavina Hamman, MD;  Location: [facility] [place];  Service: Gynecology;  Laterality: Bilateral;   LAPAROSCOPIC HYSTERECTOMY N/A 03/24/2021   Procedure: HYSTERECTOMY TOTAL LAPAROSCOPIC; INCISION OF VULVAR LESION ;  Surgeon: Lavina Hamman, MD;  Location: [facility] [place];  Service: Gynecology;  Laterality: N/A;   TUBAL LIGATION  2011   Patient Active Problem List   Diagnosis Date Noted   Cervical radiculopathy 10/08/2023   Chronic pelvic pain in female 03/24/2021   Hemorrhoids 02/14/2021   COVID-19 virus infection 09/13/2020   Other spondylosis with radiculopathy, cervical region 11/25/2019   Foraminal stenosis of cervical region 11/25/2019   B12 deficiency 09/30/2019   Chronic radicular cervical pain  09/30/2019   Pelvic pain 04/04/2012   BV (bacterial vaginosis) 04/04/2012    PCP: Betty [place]  REFERRING PROVIDER: Willia Craze  REFERRING DIAG:  N82.956 (ICD-10-CM) - Acute pain of left shoulder    THERAPY DIAG:  Cervicalgia  Acute pain of left shoulder  Stiffness of left shoulder, not elsewhere classified  Muscle weakness (generalized)  Rationale for Evaluation and Treatment: Rehabilitation  ONSET DATE: 12/31/23  SUBJECTIVE:  SUBJECTIVE STATEMENT: Everything is good. I have been at work for 2 days, I am exhausted an hurting. 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 First time back in 12 weeks.   PERTINENT HISTORY: Anterior cervical decompression/discectomy fusion 10/08/23  PAIN:  Are you having pain? Yes: NPRS scale: today is a 0 but it can get up to a 10 (it did last night 01/08/24) Pain location: neck and radiates into shoulders  Pain description: dull, ache  Aggravating factors: nothing that I noted Relieving factors: relaxing, ice pack, pain meds if I need   PRECAUTIONS: None  RED FLAGS: None   WEIGHT BEARING RESTRICTIONS: No  FALLS:  Has patient fallen in last 6 months? No  LIVING ENVIRONMENT: Lives with: lives with their family Lives in: House/apartment Stairs: Yes: Internal: 15 steps; on right going up  OCCUPATION: Does printing for pharmaceuticals   PLOF: Independent  PATIENT GOALS:just to feel better and get my motion back in my neck and arm   NEXT MD VISIT:   OBJECTIVE:  Note: Objective measures were completed at Evaluation unless otherwise noted.  DIAGNOSTIC FINDINGS:  X-rays of the cervical spine taken 12/31/2023 were independently reviewed and interpreted, showing anterior instrumentation at C5/6. No lucency seen around the  screws. Interbody device at C5/6 appears in similar position to prior films. No translation seen on flexion/extension films. No fracture or dislocation seen.   COGNITION: Overall cognitive status: Within functional limits for tasks assessed     SENSATION: WFL  POSTURE: No remarkable findings, some rounding of shoulders  CERVICAL ROM:     Active ROM Eval  Flexion 75% feels tight, unable to touch chest   Extension 50%, it is limited  Right lateral flexion  75%   Left lateral flexion  50%   Right rotation 50%  Left rotation 50%    UPPER EXTREMITY ROM:   Active ROM Right eval Left eval  Shoulder flexion WNL 160 but pain in neck  Shoulder extension    Shoulder abduction WNL 135 with pain  Shoulder adduction    Shoulder internal rotation T6 T10 with pain  Shoulder external rotation WNL WFL  Elbow flexion    Elbow extension    Wrist flexion    Wrist extension    Wrist ulnar deviation    Wrist radial deviation    Wrist pronation    Wrist supination    (Blank rows = not tested)  UPPER EXTREMITY MMT:  MMT Right eval Left eval  Shoulder flexion 5 3+ with pain  Shoulder extension    Shoulder abduction 5 3+ with pain  Shoulder adduction    Shoulder internal rotation 5 4- with pain  Shoulder external rotation 5 4  Middle trapezius    Lower trapezius    Elbow flexion    Elbow extension    Wrist flexion    Wrist extension    Wrist ulnar deviation    Wrist radial deviation    Wrist pronation    Wrist supination    Grip strength (lbs)    (Blank rows = not tested)   JOINT MOBILITY TESTING:  Full PROM for cervical and shoulder   PALPATION:  Tightness in upper traps, some trigger points. TTP at C5-C7  TREATMENT DATE: EVAL 01/09/24   PATIENT EDUCATION: Education details: POC, HEP  Person educated: Patient Education method:  Explanation Education comprehension: verbalized understanding  HOME EXERCISE PROGRAM: Access Code: [email or web address] URL: [email or web address] Date: 01/09/2024 Prepared by: Cassie Freer  Exercises - Seated Upper Trapezius Stretch  - 1 x daily - 7 x weekly - 2 sets - 2 reps - 15 hold - Seated Levator Scapulae Stretch  - 1 x daily - 7 x weekly - 2 sets - 2 reps - 15 hold - Standing Shoulder Internal Rotation Stretch with Towel  - 1 x daily - 7 x weekly - 2 sets - 10 reps - 5 hold - Standing Shoulder Flexion with Resistance  - 1 x daily - 7 x weekly - 2 sets - 10 reps - Standing Single Arm Shoulder Abduction with Resistance  - 1 x daily - 7 x weekly - 2 sets - 10 reps  ASSESSMENT:  CLINICAL IMPRESSION: Patient is a 43 y.o. female who was seen today for physical therapy evaluation and treatment for neck and shoulder pain. She had anterior cervical decompression/discectomy fusion on 10/08/23. Pt presents with some tightness in pain in cervical ROM and also in her L shoulder. The L shoulder is also weak compared to the R. She has returned back to work since her surgery and although her pain levels are low, she reports high pain levels at night especially after work and increase in activity. Patient will benefit from skilled PT to address her neck and shoulder pain, tightness, and weakness to return to PLOF and be able to work and complete daily activities with ease.   OBJECTIVE IMPAIRMENTS: decreased ROM, decreased strength, increased fascial restrictions, impaired flexibility, and pain.   REHAB POTENTIAL: Good  CLINICAL DECISION MAKING: Stable/uncomplicated  EVALUATION COMPLEXITY: Low   GOALS: Goals reviewed with patient? Yes  SHORT TERM GOALS: Target date: 02/13/24  Patient will be independent with initial HEP.  Baseline: given 01/09/24 Goal status: INITIAL  LONG TERM GOALS: Target date: 03/19/24  Patient will be independent with advanced/ongoing HEP to improve outcomes and  carryover.  Baseline:  Goal status: INITIAL  2.  Patient will be able to work a full day without increase in pain at night  Baseline: can get up to 10/10 at night  Goal status: INITIAL  3.  Patient will demonstrate full pain free cervical ROM for safety with driving.  Baseline: see chart Goal status: INITIAL  4.  Patient will demonstrate L shoulder ROM equal to R shoulder without pain Baseline: 160 flexion, 135 abd, T10 IR Goal status: INITIAL  5.  Patient will demonstrate 4+/5 or better strength in L shoulder    Baseline: 3+ Goal status: INITIAL    PLAN:  PT FREQUENCY: 1-2x/week  PT DURATION: 10 weeks  PLANNED INTERVENTIONS: 97110-Therapeutic exercises, 97530- Therapeutic activity, 97112- Neuromuscular re-education, 97535- Self Care, 59563- Manual therapy, Patient/Family education, Taping, Dry Needling, Joint mobilization, Joint manipulation, Spinal manipulation, Spinal mobilization, Cryotherapy, and Moist heat  PLAN FOR NEXT SESSION: PROM and stretching for neck and L shoulder, L shoulder strengthening    Cassie Freer, PT 01/09/2024, 9:27 AM

## 2024-01-09 ENCOUNTER — Ambulatory Visit: Payer: BC Managed Care – PPO | Attending: Orthopedic Surgery

## 2024-01-09 DIAGNOSIS — M542 Cervicalgia: Secondary | ICD-10-CM | POA: Insufficient documentation

## 2024-01-09 DIAGNOSIS — M25512 Pain in left shoulder: Secondary | ICD-10-CM | POA: Diagnosis not present

## 2024-01-09 DIAGNOSIS — M6281 Muscle weakness (generalized): Secondary | ICD-10-CM | POA: Insufficient documentation

## 2024-01-09 DIAGNOSIS — M25612 Stiffness of left shoulder, not elsewhere classified: Secondary | ICD-10-CM | POA: Diagnosis not present

## 2024-01-17 NOTE — Therapy (Signed)
 OUTPATIENT PHYSICAL THERAPY SHOULDER TREATMENT   Patient Name: Linda Hopkins MRN: [id number] DOB:04-Jul-1981, 43 y.o., female Today's Date: 01/18/2024  END OF SESSION:  PT End of Session - 01/18/24 0921     Visit Number 2    Date for PT Re-Evaluation 03/19/24    Authorization Type BCBS    PT Start Time 0922    PT Stop Time 1005    PT Time Calculation (min) 43 min    Activity Tolerance Patient tolerated treatment well    Behavior During Therapy [facility] for tasks assessed/performed              Past Medical History:  Diagnosis Date   Arthritis    oa hips   COVID 09/2020   congestion body aches x 7 days all symtposm reolved   Pelvic pain    Wears dentures    upper   Wears glasses    Past Surgical History:  Procedure Laterality Date   ANTERIOR CERVICAL DECOMP/DISCECTOMY FUSION N/A 10/08/2023   Procedure: C5-6 ANTERIOR CERVICAL DECOMPRESSION/DISCECTOMY FUSION 1 LEVEL;  Surgeon: London Sheer, MD;  Location: [facility] OR;  Service: Orthopedics;  Laterality: N/A;   CESAREAN SECTION     x 2   CYSTOSCOPY N/A 03/24/2021   Procedure: CYSTOSCOPY;  Surgeon: Lavina Hamman, MD;  Location: [facility];  Service: Gynecology;  Laterality: N/A;   DILATION AND CURETTAGE OF UTERUS  2004   LAPAROSCOPIC BILATERAL SALPINGECTOMY Bilateral 03/24/2021   Procedure: LAPAROSCOPIC BILATERAL SALPINGECTOMY;  Surgeon: Lavina Hamman, MD;  Location: [facility] [place];  Service: Gynecology;  Laterality: Bilateral;   LAPAROSCOPIC HYSTERECTOMY N/A 03/24/2021   Procedure: HYSTERECTOMY TOTAL LAPAROSCOPIC; INCISION OF VULVAR LESION ;  Surgeon: Lavina Hamman, MD;  Location: [facility] [place];  Service: Gynecology;  Laterality: N/A;   TUBAL LIGATION  2011   Patient Active Problem List   Diagnosis Date Noted   Cervical radiculopathy 10/08/2023   Chronic pelvic pain in female 03/24/2021   Hemorrhoids 02/14/2021   COVID-19 virus infection 09/13/2020   Other spondylosis with  radiculopathy, cervical region 11/25/2019   Foraminal stenosis of cervical region 11/25/2019   B12 deficiency 09/30/2019   Chronic radicular cervical pain 09/30/2019   Pelvic pain 04/04/2012   BV (bacterial vaginosis) 04/04/2012    PCP: Betty Swaziland  REFERRING PROVIDER: Willia Craze  REFERRING DIAG:  B28.413 (ICD-10-CM) - Acute pain of left shoulder    THERAPY DIAG:  Cervicalgia  Acute pain of left shoulder  Stiffness of left shoulder, not elsewhere classified  Muscle weakness (generalized)  Rationale for Evaluation and Treatment: Rehabilitation  ONSET DATE: 12/31/23  SUBJECTIVE:  SUBJECTIVE STATEMENT: Shoulder is good, no pain. Neck is doing pretty good.   PERTINENT HISTORY: Anterior cervical decompression/discectomy fusion 10/08/23  PAIN:  Are you having pain? Yes: NPRS scale: today is a 0 but it can get up to a 10 (it did last night 01/08/24) Pain location: neck and radiates into shoulders  Pain description: dull, ache  Aggravating factors: nothing that I noted Relieving factors: relaxing, ice pack, pain meds if I need   PRECAUTIONS: None  RED FLAGS: None   WEIGHT BEARING RESTRICTIONS: No  FALLS:  Has patient fallen in last 6 months? No  LIVING ENVIRONMENT: Lives with: lives with their family Lives in: House/apartment Stairs: Yes: Internal: 15 steps; on right going up  OCCUPATION: Does printing for pharmaceuticals   PLOF: Independent  PATIENT GOALS:just to feel better and get my motion back in my neck and arm   NEXT MD VISIT:   OBJECTIVE:  Note: Objective measures were completed at Evaluation unless otherwise noted.  DIAGNOSTIC FINDINGS:  X-rays of the cervical spine taken 12/31/2023 were independently reviewed and interpreted, showing anterior instrumentation at C5/6.  No lucency seen around the screws. Interbody device at C5/6 appears in similar position to prior films. No translation seen on flexion/extension films. No fracture or dislocation seen.   COGNITION: Overall cognitive status: Within functional limits for tasks assessed     SENSATION: WFL  POSTURE: No remarkable findings, some rounding of shoulders  CERVICAL ROM:     Active ROM Eval  Flexion 75% feels tight, unable to touch chest   Extension 50%, it is limited  Right lateral flexion  75%   Left lateral flexion  50%   Right rotation 50%  Left rotation 50%    UPPER EXTREMITY ROM:   Active ROM Right eval Left eval  Shoulder flexion WNL 160 but pain in neck  Shoulder extension    Shoulder abduction WNL 135 with pain  Shoulder adduction    Shoulder internal rotation T6 T10 with pain  Shoulder external rotation WNL WFL  Elbow flexion    Elbow extension    Wrist flexion    Wrist extension    Wrist ulnar deviation    Wrist radial deviation    Wrist pronation    Wrist supination    (Blank rows = not tested)  UPPER EXTREMITY MMT:  MMT Right eval Left eval  Shoulder flexion 5 3+ with pain  Shoulder extension    Shoulder abduction 5 3+ with pain  Shoulder adduction    Shoulder internal rotation 5 4- with pain  Shoulder external rotation 5 4  Middle trapezius    Lower trapezius    Elbow flexion    Elbow extension    Wrist flexion    Wrist extension    Wrist ulnar deviation    Wrist radial deviation    Wrist pronation    Wrist supination    Grip strength (lbs)    (Blank rows = not tested)   JOINT MOBILITY TESTING:  Full PROM for cervical and shoulder   PALPATION:  Tightness in upper traps, some trigger points. TTP at C5-C7  TREATMENT DATE:  01/18/24 UBE L2x44mins  Pec stretch 30s x2 Rows and ext red band 2x10 Shoulder flexion and abd  2# 2x10 Chin tucks red band 2x10 UT stretch 30s each side  Horizontal abd red band 2x10 Shoulder ext 5# 2x10 Seated row 15# 2x10  EVAL 01/09/24   PATIENT EDUCATION: Education details: POC, HEP  Person educated: Patient Education method: Explanation Education comprehension: verbalized understanding  HOME EXERCISE PROGRAM: Access Code: [email or web address] URL: [email or web address] Date: 01/09/2024 Prepared by: Cassie Freer  Exercises - Seated Upper Trapezius Stretch  - 1 x daily - 7 x weekly - 2 sets - 2 reps - 15 hold - Seated Levator Scapulae Stretch  - 1 x daily - 7 x weekly - 2 sets - 2 reps - 15 hold - Standing Shoulder Internal Rotation Stretch with Towel  - 1 x daily - 7 x weekly - 2 sets - 10 reps - 5 hold - Standing Shoulder Flexion with Resistance  - 1 x daily - 7 x weekly - 2 sets - 10 reps - Standing Single Arm Shoulder Abduction with Resistance  - 1 x daily - 7 x weekly - 2 sets - 10 reps  ASSESSMENT:  CLINICAL IMPRESSION: Patient returns for first treatment session after evaluation feeling great. Reports the HEP is going well and she has no pain today. She states the pain at night has also gotten better. We focused on some strengthening today for upper back, shoulders, and neck. She tolerates all exercises really well without pain.     Patient is a 43 y.o. female who was seen today for physical therapy evaluation and treatment for neck and shoulder pain. She had anterior cervical decompression/discectomy fusion on 10/08/23. Pt presents with some tightness in pain in cervical ROM and also in her L shoulder. The L shoulder is also weak compared to the R. She has returned back to work since her surgery and although her pain levels are low, she reports high pain levels at night especially after work and increase in activity. Patient will benefit from skilled PT to address her neck and shoulder pain, tightness, and weakness to return to PLOF and be able to work and complete  daily activities with ease.   OBJECTIVE IMPAIRMENTS: decreased ROM, decreased strength, increased fascial restrictions, impaired flexibility, and pain.   REHAB POTENTIAL: Good  CLINICAL DECISION MAKING: Stable/uncomplicated  EVALUATION COMPLEXITY: Low   GOALS: Goals reviewed with patient? Yes  SHORT TERM GOALS: Target date: 02/13/24  Patient will be independent with initial HEP.  Baseline: given 01/09/24 Goal status: INITIAL  LONG TERM GOALS: Target date: 03/19/24  Patient will be independent with advanced/ongoing HEP to improve outcomes and carryover.  Baseline:  Goal status: INITIAL  2.  Patient will be able to work a full day without increase in pain at night  Baseline: can get up to 10/10 at night  Goal status: INITIAL  3.  Patient will demonstrate full pain free cervical ROM for safety with driving.  Baseline: see chart Goal status: INITIAL  4.  Patient will demonstrate L shoulder ROM equal to R shoulder without pain Baseline: 160 flexion, 135 abd, T10 IR Goal status: INITIAL  5.  Patient will demonstrate 4+/5 or better strength in L shoulder    Baseline: 3+ Goal status: INITIAL    PLAN:  PT FREQUENCY: 1-2x/week  PT DURATION: 10 weeks  PLANNED INTERVENTIONS: 97110-Therapeutic exercises, 97530- Therapeutic activity, 97112- Neuromuscular re-education, 97535- Self Care, [id number]- Manual therapy, Patient/Family education, Taping, Dry Needling,  Joint mobilization, Joint manipulation, Spinal manipulation, Spinal mobilization, Cryotherapy, and Moist heat  PLAN FOR NEXT SESSION: PROM and stretching for neck and L shoulder, L shoulder strengthening    Cassie Freer, PT 01/18/2024, 10:00 AM

## 2024-01-18 ENCOUNTER — Ambulatory Visit

## 2024-01-18 DIAGNOSIS — M542 Cervicalgia: Secondary | ICD-10-CM | POA: Diagnosis not present

## 2024-01-18 DIAGNOSIS — M25612 Stiffness of left shoulder, not elsewhere classified: Secondary | ICD-10-CM | POA: Diagnosis not present

## 2024-01-18 DIAGNOSIS — M25512 Pain in left shoulder: Secondary | ICD-10-CM | POA: Diagnosis not present

## 2024-01-18 DIAGNOSIS — M6281 Muscle weakness (generalized): Secondary | ICD-10-CM | POA: Diagnosis not present

## 2024-01-21 DIAGNOSIS — L720 Epidermal cyst: Secondary | ICD-10-CM | POA: Diagnosis not present

## 2024-01-22 NOTE — Therapy (Signed)
 OUTPATIENT PHYSICAL THERAPY SHOULDER TREATMENT   Patient Name: Linda Hopkins MRN: [id number] DOB:[birth year], 43 y.o., female Today's Date: 01/23/2024  END OF SESSION:  PT End of Session - 01/23/24 0848     Visit Number 3    Date for PT Re-Evaluation 03/19/24    Authorization Type BCBS    PT Start Time 0845    PT Stop Time 0930    PT Time Calculation (min) 45 min    Activity Tolerance Patient tolerated treatment well    Behavior During Therapy [facility] for tasks assessed/performed               Past Medical History:  Diagnosis Date   Arthritis    oa hips   COVID 09/2020   congestion body aches x 7 days all symtposm reolved   Pelvic pain    Wears dentures    upper   Wears glasses    Past Surgical History:  Procedure Laterality Date   ANTERIOR CERVICAL DECOMP/DISCECTOMY FUSION N/A 10/08/2023   Procedure: C5-6 ANTERIOR CERVICAL DECOMPRESSION/DISCECTOMY FUSION 1 LEVEL;  Surgeon: London Sheer, MD;  Location: [facility] OR;  Service: Orthopedics;  Laterality: N/A;   CESAREAN SECTION     x 2   CYSTOSCOPY N/A 03/24/2021   Procedure: CYSTOSCOPY;  Surgeon: Lavina Hamman, MD;  Location: [facility];  Service: Gynecology;  Laterality: N/A;   DILATION AND CURETTAGE OF UTERUS  2004   LAPAROSCOPIC BILATERAL SALPINGECTOMY Bilateral 03/24/2021   Procedure: LAPAROSCOPIC BILATERAL SALPINGECTOMY;  Surgeon: Lavina Hamman, MD;  Location: [facility] [place];  Service: Gynecology;  Laterality: Bilateral;   LAPAROSCOPIC HYSTERECTOMY N/A 03/24/2021   Procedure: HYSTERECTOMY TOTAL LAPAROSCOPIC; INCISION OF VULVAR LESION ;  Surgeon: Lavina Hamman, MD;  Location: [facility] [place];  Service: Gynecology;  Laterality: N/A;   TUBAL LIGATION  2011   Patient Active Problem List   Diagnosis Date Noted   Cervical radiculopathy 10/08/2023   Chronic pelvic pain in female 03/24/2021   Hemorrhoids 02/14/2021   COVID-19 virus infection 09/13/2020   Other spondylosis with  radiculopathy, cervical region 11/25/2019   Foraminal stenosis of cervical region 11/25/2019   B12 deficiency 09/30/2019   Chronic radicular cervical pain 09/30/2019   Pelvic pain 04/04/2012   BV (bacterial vaginosis) 04/04/2012    PCP: Betty [place]  REFERRING PROVIDER: Willia Craze  REFERRING DIAG:  W09.811 (ICD-10-CM) - Acute pain of left shoulder    THERAPY DIAG:  Stiffness of left shoulder, not elsewhere classified  Muscle weakness (generalized)  Rationale for Evaluation and Treatment: Rehabilitation  ONSET DATE: 12/31/23  SUBJECTIVE:  SUBJECTIVE STATEMENT: I feel great.   PERTINENT HISTORY: Anterior cervical decompression/discectomy fusion 10/08/23  PAIN:  Are you having pain? Yes: NPRS scale: today is a 0 but it can get up to a 10 (it did last night 01/08/24) Pain location: neck and radiates into shoulders  Pain description: dull, ache  Aggravating factors: nothing that I noted Relieving factors: relaxing, ice pack, pain meds if I need   PRECAUTIONS: None  RED FLAGS: None   WEIGHT BEARING RESTRICTIONS: No  FALLS:  Has patient fallen in last 6 months? No  LIVING ENVIRONMENT: Lives with: lives with their family Lives in: House/apartment Stairs: Yes: Internal: 15 steps; on right going up  OCCUPATION: Does printing for pharmaceuticals   PLOF: Independent  PATIENT GOALS:just to feel better and get my motion back in my neck and arm   NEXT MD VISIT:   OBJECTIVE:  Note: Objective measures were completed at Evaluation unless otherwise noted.  DIAGNOSTIC FINDINGS:  X-rays of the cervical spine taken 12/31/2023 were independently reviewed and interpreted, showing anterior instrumentation at C5/6. No lucency seen around the screws. Interbody device at C5/6 appears in similar  position to prior films. No translation seen on flexion/extension films. No fracture or dislocation seen.   COGNITION: Overall cognitive status: Within functional limits for tasks assessed     SENSATION: WFL  POSTURE: No remarkable findings, some rounding of shoulders  CERVICAL ROM:     Active ROM Eval  Flexion 75% feels tight, unable to touch chest   Extension 50%, it is limited  Right lateral flexion  75%   Left lateral flexion  50%   Right rotation 50%  Left rotation 50%    UPPER EXTREMITY ROM:   Active ROM Right eval Left eval  Shoulder flexion WNL 160 but pain in neck  Shoulder extension    Shoulder abduction WNL 135 with pain  Shoulder adduction    Shoulder internal rotation T6 T10 with pain  Shoulder external rotation WNL WFL  Elbow flexion    Elbow extension    Wrist flexion    Wrist extension    Wrist ulnar deviation    Wrist radial deviation    Wrist pronation    Wrist supination    (Blank rows = not tested)  UPPER EXTREMITY MMT:  MMT Right eval Left eval  Shoulder flexion 5 3+ with pain  Shoulder extension    Shoulder abduction 5 3+ with pain  Shoulder adduction    Shoulder internal rotation 5 4- with pain  Shoulder external rotation 5 4  Middle trapezius    Lower trapezius    Elbow flexion    Elbow extension    Wrist flexion    Wrist extension    Wrist ulnar deviation    Wrist radial deviation    Wrist pronation    Wrist supination    Grip strength (lbs)    (Blank rows = not tested)   JOINT MOBILITY TESTING:  Full PROM for cervical and shoulder   PALPATION:  Tightness in upper traps, some trigger points. TTP at C5-C7  TREATMENT DATE:  01/23/24 UBE L3 x75mins each way 3 way shoulder reaches x10 each side red  ITY 2# 2x10 Chin tuck against ball on wall: -shoulder flexion 3# WaTE 2x10 -horizontal abd red  2x10  -scapular retractions red 2x10 Seated row 20# 2x10 Lat pull downs 20# 2x10  01/18/24 UBE L2x54mins  Pec stretch 30s x2 Rows and ext red band 2x10 Shoulder flexion and abd 2# 2x10 Chin tucks red band 2x10 UT stretch 30s each side  Horizontal abd red band 2x10 Shoulder ext 5# 2x10 Seated row 15# 2x10  EVAL 01/09/24   PATIENT EDUCATION: Education details: POC, HEP  Person educated: Patient Education method: Explanation Education comprehension: verbalized understanding  HOME EXERCISE PROGRAM: Access Code: [email or web address] URL: [email or web address] Date: 01/09/2024 Prepared by: Cassie Freer  Exercises - Seated Upper Trapezius Stretch  - 1 x daily - 7 x weekly - 2 sets - 2 reps - 15 hold - Seated Levator Scapulae Stretch  - 1 x daily - 7 x weekly - 2 sets - 2 reps - 15 hold - Standing Shoulder Internal Rotation Stretch with Towel  - 1 x daily - 7 x weekly - 2 sets - 10 reps - 5 hold - Standing Shoulder Flexion with Resistance  - 1 x daily - 7 x weekly - 2 sets - 10 reps - Standing Single Arm Shoulder Abduction with Resistance  - 1 x daily - 7 x weekly - 2 sets - 10 reps  ASSESSMENT:  CLINICAL IMPRESSION: Patient returns feeling great and continuing to show good progress. She is able to reach back into further internal rotation compared to eval. She reports no pain. focused on some strengthening today for upper back, shoulders, and neck. She tolerates all exercises well without pain but has some muscle fatigue and burning with 3 way reaches and IYT.    Patient is a 43 y.o. female who was seen today for physical therapy evaluation and treatment for neck and shoulder pain. She had anterior cervical decompression/discectomy fusion on 10/08/23. Pt presents with some tightness in pain in cervical ROM and also in her L shoulder. The L shoulder is also weak compared to the R. She has returned back to work since her surgery and although her pain levels are low, she reports high  pain levels at night especially after work and increase in activity. Patient will benefit from skilled PT to address her neck and shoulder pain, tightness, and weakness to return to PLOF and be able to work and complete daily activities with ease.   OBJECTIVE IMPAIRMENTS: decreased ROM, decreased strength, increased fascial restrictions, impaired flexibility, and pain.   REHAB POTENTIAL: Good  CLINICAL DECISION MAKING: Stable/uncomplicated  EVALUATION COMPLEXITY: Low   GOALS: Goals reviewed with patient? Yes  SHORT TERM GOALS: Target date: 02/13/24  Patient will be independent with initial HEP.  Baseline: given 01/09/24 Goal status: MET  LONG TERM GOALS: Target date: 03/19/24  Patient will be independent with advanced/ongoing HEP to improve outcomes and carryover.  Baseline:  Goal status: INITIAL  2.  Patient will be able to work a full day without increase in pain at night  Baseline: can get up to 10/10 at night  Goal status: INITIAL  3.  Patient will demonstrate full pain free cervical ROM for safety with driving.  Baseline: see chart Goal status: INITIAL  4.  Patient will demonstrate L shoulder ROM equal to R shoulder without pain Baseline: 160 flexion, 135 abd, T10 IR Goal status: INITIAL  5.  Patient will demonstrate 4+/5 or better strength in L shoulder    Baseline: 3+ Goal status: INITIAL    PLAN:  PT FREQUENCY: 1-2x/week  PT DURATION: 10 weeks  PLANNED INTERVENTIONS: 97110-Therapeutic exercises, 97530- Therapeutic activity, 97112- Neuromuscular re-education, 97535- Self Care, [id number]- Manual therapy, Patient/Family education, Taping, Dry Needling, Joint mobilization, Joint manipulation, Spinal manipulation, Spinal mobilization, Cryotherapy, and Moist heat  PLAN FOR NEXT SESSION: PROM and stretching for neck and L shoulder, L shoulder strengthening    Cassie Freer, PT 01/23/2024, 9:29 AM

## 2024-01-23 ENCOUNTER — Ambulatory Visit

## 2024-01-23 DIAGNOSIS — M6281 Muscle weakness (generalized): Secondary | ICD-10-CM | POA: Diagnosis not present

## 2024-01-23 DIAGNOSIS — M25612 Stiffness of left shoulder, not elsewhere classified: Secondary | ICD-10-CM

## 2024-01-23 DIAGNOSIS — M542 Cervicalgia: Secondary | ICD-10-CM | POA: Diagnosis not present

## 2024-01-23 DIAGNOSIS — M25512 Pain in left shoulder: Secondary | ICD-10-CM | POA: Diagnosis not present

## 2024-01-24 ENCOUNTER — Ambulatory Visit: Admitting: Physical Therapy

## 2024-01-28 ENCOUNTER — Ambulatory Visit

## 2024-01-29 ENCOUNTER — Ambulatory Visit: Admitting: Physical Therapy

## 2024-02-05 NOTE — Therapy (Signed)
 OUTPATIENT PHYSICAL THERAPY SHOULDER TREATMENT   Patient Name: Linda Hopkins MRN: [id number] DOB:[birth year], 43 y.o., female Today's Date: 02/06/2024  END OF SESSION:  PT End of Session - 02/06/24 0846     Visit Number 4    Date for PT Re-Evaluation 03/19/24    Authorization Type BCBS    PT Start Time 0845    PT Stop Time 0920    PT Time Calculation (min) 35 min    Activity Tolerance Patient tolerated treatment well    Behavior During Therapy [facility] for tasks assessed/performed                Past Medical History:  Diagnosis Date   Arthritis    oa hips   COVID 09/2020   congestion body aches x 7 days all symtposm reolved   Pelvic pain    Wears dentures    upper   Wears glasses    Past Surgical History:  Procedure Laterality Date   ANTERIOR CERVICAL DECOMP/DISCECTOMY FUSION N/A 10/08/2023   Procedure: C5-6 ANTERIOR CERVICAL DECOMPRESSION/DISCECTOMY FUSION 1 LEVEL;  Surgeon: London Sheer, MD;  Location: [facility] OR;  Service: Orthopedics;  Laterality: N/A;   CESAREAN SECTION     x 2   CYSTOSCOPY N/A 03/24/2021   Procedure: CYSTOSCOPY;  Surgeon: Lavina Hamman, MD;  Location: [facility];  Service: Gynecology;  Laterality: N/A;   DILATION AND CURETTAGE OF UTERUS  2004   LAPAROSCOPIC BILATERAL SALPINGECTOMY Bilateral 03/24/2021   Procedure: LAPAROSCOPIC BILATERAL SALPINGECTOMY;  Surgeon: Lavina Hamman, MD;  Location: [facility];  Service: Gynecology;  Laterality: Bilateral;   LAPAROSCOPIC HYSTERECTOMY N/A 03/24/2021   Procedure: HYSTERECTOMY TOTAL LAPAROSCOPIC; INCISION OF VULVAR LESION ;  Surgeon: Lavina Hamman, MD;  Location: [facility];  Service: Gynecology;  Laterality: N/A;   TUBAL LIGATION  2011   Patient Active Problem List   Diagnosis Date Noted   Cervical radiculopathy 10/08/2023   Chronic pelvic pain in female 03/24/2021   Hemorrhoids 02/14/2021   COVID-19 virus infection 09/13/2020   Other spondylosis with  radiculopathy, cervical region 11/25/2019   Foraminal stenosis of cervical region 11/25/2019   B12 deficiency 09/30/2019   Chronic radicular cervical pain 09/30/2019   Pelvic pain 04/04/2012   BV (bacterial vaginosis) 04/04/2012    PCP: Betty [place]  REFERRING PROVIDER: Willia Craze  REFERRING DIAG:  Y86.578 (ICD-10-CM) - Acute pain of left shoulder    THERAPY DIAG:  Stiffness of left shoulder, not elsewhere classified  Muscle weakness (generalized)  Cervicalgia  Acute pain of left shoulder  Rationale for Evaluation and Treatment: Rehabilitation  ONSET DATE: 12/31/23  SUBJECTIVE:  SUBJECTIVE STATEMENT: I feel good. I think I am good, don't need anymore appointments  PERTINENT HISTORY: Anterior cervical decompression/discectomy fusion 10/08/23  PAIN:  Are you having pain? Yes: NPRS scale: today is a 0 but it can get up to a 10 (it did last night 01/08/24) Pain location: neck and radiates into shoulders  Pain description: dull, ache  Aggravating factors: nothing that I noted Relieving factors: relaxing, ice pack, pain meds if I need   PRECAUTIONS: None  RED FLAGS: None   WEIGHT BEARING RESTRICTIONS: No  FALLS:  Has patient fallen in last 6 months? No  LIVING ENVIRONMENT: Lives with: lives with their family Lives in: House/apartment Stairs: Yes: Internal: 15 steps; on right going up  OCCUPATION: Does printing for pharmaceuticals   PLOF: Independent  PATIENT GOALS:just to feel better and get my motion back in my neck and arm   NEXT MD VISIT:   OBJECTIVE:  Note: Objective measures were completed at Evaluation unless otherwise noted.  DIAGNOSTIC FINDINGS:  X-rays of the cervical spine taken 12/31/2023 were independently reviewed and interpreted, showing anterior instrumentation  at C5/6. No lucency seen around the screws. Interbody device at C5/6 appears in similar position to prior films. No translation seen on flexion/extension films. No fracture or dislocation seen.   COGNITION: Overall cognitive status: Within functional limits for tasks assessed     SENSATION: WFL  POSTURE: No remarkable findings, some rounding of shoulders  CERVICAL ROM:     Active ROM Eval 02/06/24  Flexion 75% feels tight, unable to touch chest  [facility]  Extension 50%, it is limited 80% but no pain  Right lateral flexion  75%  WFL  Left lateral flexion  50%  WFL  Right rotation 50% WFL  Left rotation 50% WFL    UPPER EXTREMITY ROM:   Active ROM Right eval Left eval  Shoulder flexion WNL 160 but pain in neck  Shoulder extension    Shoulder abduction WNL 135 with pain  Shoulder adduction    Shoulder internal rotation T6 T10 with pain  Shoulder external rotation WNL WFL  Elbow flexion    Elbow extension    Wrist flexion    Wrist extension    Wrist ulnar deviation    Wrist radial deviation    Wrist pronation    Wrist supination    (Blank rows = not tested)  UPPER EXTREMITY MMT:  MMT Right eval Left eval  Shoulder flexion 5 3+ with pain  Shoulder extension    Shoulder abduction 5 3+ with pain  Shoulder adduction    Shoulder internal rotation 5 4- with pain  Shoulder external rotation 5 4  Middle trapezius    Lower trapezius    Elbow flexion    Elbow extension    Wrist flexion    Wrist extension    Wrist ulnar deviation    Wrist radial deviation    Wrist pronation    Wrist supination    Grip strength (lbs)    (Blank rows = not tested)   JOINT MOBILITY TESTING:  Full PROM for cervical and shoulder   PALPATION:  Tightness in upper traps, some trigger points. TTP at C5-C7  TREATMENT DATE:  02/06/24 UBE L4 x83mins each way  ER/IR 5#  2x10 Shoulder ext 10# 2x10 Cable rows 10# 2x10 Overhead carry 5# 2 laps  Farmer's carry 20# 2 laps  SA scoop lifts red 2x10 Seated row 25# 2x10 Lat pull down 25# 2x10   01/23/24 UBE L3 x51mins each way 3 way shoulder reaches x10 each side red  ITY 2# 2x10 Chin tuck against ball on wall: -shoulder flexion 3# WaTE 2x10 -horizontal abd red 2x10  -scapular retractions red 2x10 Seated row 20# 2x10 Lat pull downs 20# 2x10  01/18/24 UBE L2x75mins  Pec stretch 30s x2 Rows and ext red band 2x10 Shoulder flexion and abd 2# 2x10 Chin tucks red band 2x10 UT stretch 30s each side  Horizontal abd red band 2x10 Shoulder ext 5# 2x10 Seated row 15# 2x10  EVAL 01/09/24   PATIENT EDUCATION: Education details: POC, HEP  Person educated: Patient Education method: Explanation Education comprehension: verbalized understanding  HOME EXERCISE PROGRAM: Access Code: [email or web address] URL: [email or web address] Date: 01/09/2024 Prepared by: Cassie Freer  Exercises - Seated Upper Trapezius Stretch  - 1 x daily - 7 x weekly - 2 sets - 2 reps - 15 hold - Seated Levator Scapulae Stretch  - 1 x daily - 7 x weekly - 2 sets - 2 reps - 15 hold - Standing Shoulder Internal Rotation Stretch with Towel  - 1 x daily - 7 x weekly - 2 sets - 10 reps - 5 hold - Standing Shoulder Flexion with Resistance  - 1 x daily - 7 x weekly - 2 sets - 10 reps - Standing Single Arm Shoulder Abduction with Resistance  - 1 x daily - 7 x weekly - 2 sets - 10 reps  ASSESSMENT:  CLINICAL IMPRESSION: Patient returns feeling great and continuing to show good progress. She reports no pain. She has met all of her goals and is pleased with her function. We agreed to d/c now that she has returned to PLOF.   Patient is a 43 y.o. female who was seen today for physical therapy evaluation and treatment for neck and shoulder pain. She had anterior cervical decompression/discectomy fusion on 10/08/23. Pt presents with some tightness  in pain in cervical ROM and also in her L shoulder. The L shoulder is also weak compared to the R. She has returned back to work since her surgery and although her pain levels are low, she reports high pain levels at night especially after work and increase in activity. Patient will benefit from skilled PT to address her neck and shoulder pain, tightness, and weakness to return to PLOF and be able to work and complete daily activities with ease.   OBJECTIVE IMPAIRMENTS: decreased ROM, decreased strength, increased fascial restrictions, impaired flexibility, and pain.   REHAB POTENTIAL: Good  CLINICAL DECISION MAKING: Stable/uncomplicated  EVALUATION COMPLEXITY: Low   GOALS: Goals reviewed with patient? Yes  SHORT TERM GOALS: Target date: 02/13/24  Patient will be independent with initial HEP.  Baseline: given 01/09/24 Goal status: MET  LONG TERM GOALS: Target date: 03/19/24  Patient will be independent with advanced/ongoing HEP to improve outcomes and carryover.  Baseline:  Goal status: MET  2.  Patient will be able to work a full day without increase in pain at night  Baseline: can get up to 10/10 at night  Goal status: MET 02/06/24  3.  Patient will demonstrate full pain free cervical ROM for safety with driving.  Baseline: see chart Goal status: MET 02/06/24  4.  Patient will demonstrate L shoulder ROM equal to R shoulder without pain Baseline: 160 flexion, 135 abd, T10 IR Goal status: MET all [facility] 02/06/24  5.  Patient will demonstrate 4+/5 or better strength in L shoulder    Baseline: 3+ Goal status: MET 4+/5 02/06/24    PLAN:  PT FREQUENCY: 1-2x/week  PT DURATION: 10 weeks  PLANNED INTERVENTIONS: 97110-Therapeutic exercises, 97530- Therapeutic activity, 97112- Neuromuscular re-education, 97535- Self Care, 16109- Manual therapy, Patient/Family education, Taping, Dry Needling, Joint mobilization, Joint manipulation, Spinal manipulation, Spinal mobilization, Cryotherapy, and  Moist heat  PLAN FOR NEXT SESSION: PROM and stretching for neck and L shoulder, L shoulder strengthening   PHYSICAL THERAPY DISCHARGE SUMMARY  Visits from Start of Care: 4   Patient agrees to discharge. Patient goals were met. Patient is being discharged due to being pleased with the current functional level.   [place], PT 02/06/2024, 9:20 AM

## 2024-02-06 ENCOUNTER — Ambulatory Visit: Attending: Orthopedic Surgery

## 2024-02-06 DIAGNOSIS — M6281 Muscle weakness (generalized): Secondary | ICD-10-CM | POA: Diagnosis not present

## 2024-02-06 DIAGNOSIS — M542 Cervicalgia: Secondary | ICD-10-CM | POA: Insufficient documentation

## 2024-02-06 DIAGNOSIS — M25612 Stiffness of left shoulder, not elsewhere classified: Secondary | ICD-10-CM | POA: Insufficient documentation

## 2024-02-06 DIAGNOSIS — M25512 Pain in left shoulder: Secondary | ICD-10-CM | POA: Diagnosis not present

## 2024-02-07 ENCOUNTER — Ambulatory Visit

## 2024-02-18 ENCOUNTER — Encounter: Payer: Self-pay | Admitting: Orthopedic Surgery

## 2024-02-18 ENCOUNTER — Telehealth: Payer: Self-pay | Admitting: Orthopedic Surgery

## 2024-02-18 NOTE — Telephone Encounter (Signed)
 Patient sent my chart message, please see that for response

## 2024-02-18 NOTE — Telephone Encounter (Signed)
 Patient called and said that her right arm is going numb. CB#[phone]

## 2024-02-25 ENCOUNTER — Ambulatory Visit: Admitting: Orthopedic Surgery

## 2024-03-20 ENCOUNTER — Other Ambulatory Visit (INDEPENDENT_AMBULATORY_CARE_PROVIDER_SITE_OTHER): Payer: Self-pay

## 2024-03-20 ENCOUNTER — Ambulatory Visit: Admitting: Orthopedic Surgery

## 2024-03-20 DIAGNOSIS — Z981 Arthrodesis status: Secondary | ICD-10-CM

## 2024-03-20 NOTE — Progress Notes (Signed)
 Orthopedic Surgery Post-operative Office Visit   Procedure: C5/6 ACDF Date of Surgery: 10/08/2023 (~6 months post-op)   Assessment: Patient is a 43 y.o. female who is doing well after surgery     Plan: -Operative plans complete -No spine specific precautions -Pain management: OTC medications as needed -Return to office in 6 months, x-rays needed at next visit: AP/lateral/flex/ex cervical   ___________________________________________________________________________     Subjective: Patient has been doing well since her last in the office.  She is not having any neck or radiating arm pain.  She does notice her right arm is numb when she wakes up in the morning but states she shakes it out it goes away.  She does not have any daytime numbness or paresthesias.  She does not have any other complaints at this time.   Objective:   General: no acute distress, appropriate affect Neurologic: alert, answering questions appropriately, following commands Respiratory: unlabored breathing on room air Skin: incision is well healed    MSK (spine):   -Strength exam                                                   Left                  Right Grip strength                5/5                  5/5 Interosseus                  5/5                  5/5 Wrist extension            5/5                  5/5 Wrist flexion                 5/5                  5/5 Elbow flexion                5/5                  5/5 Deltoid                          5/5                  5/5     -Sensory exam                           Sensation intact to light touch in C5-T1 nerve distributions of bilateral upper extremities     Imaging: X-rays of the cervical spine taken 03/20/2024 were independently reviewed and interpreted, showing interbody device in the former C5/6 disc space.  The interbody appears in the appropriate position.  Anterior instrumentation at C5 and C6.  No lucency seen around the screws.  No  translation seen on the flexion/extension films.  No fracture or dislocation seen.     Patient name: Linda Hopkins Patient MRN: [id number] Date of visit: 03/20/24

## 2024-03-27 ENCOUNTER — Ambulatory Visit: Payer: BC Managed Care – PPO | Admitting: Orthopedic Surgery

## 2024-05-23 ENCOUNTER — Encounter: Payer: Self-pay | Admitting: Advanced Practice Midwife

## 2024-06-07 ENCOUNTER — Ambulatory Visit

## 2024-06-08 ENCOUNTER — Ambulatory Visit (INDEPENDENT_AMBULATORY_CARE_PROVIDER_SITE_OTHER): Admitting: Radiology

## 2024-06-08 ENCOUNTER — Ambulatory Visit
Admission: RE | Admit: 2024-06-08 | Discharge: 2024-06-08 | Disposition: A | Discharge status: Home / Self Care | Source: Ambulatory Visit | Attending: Emergency Medicine | Admitting: Emergency Medicine

## 2024-06-08 VITALS — BP 108/72 | HR 75 | Temp 98.1°F | Resp 16

## 2024-06-08 DIAGNOSIS — M25532 Pain in left wrist: Secondary | ICD-10-CM | POA: Diagnosis not present

## 2024-06-08 DIAGNOSIS — M25832 Other specified joint disorders, left wrist: Secondary | ICD-10-CM | POA: Diagnosis not present

## 2024-06-08 DIAGNOSIS — M67432 Ganglion, left wrist: Secondary | ICD-10-CM

## 2024-06-08 MED ORDER — NAPROXEN 500 MG PO TABS: 500.0000 mg | ORAL_TABLET | Freq: Two times a day (BID) | ORAL | 0 refills | Status: DC

## 2024-06-08 NOTE — ED Provider Notes (Signed)
 [facility]    CSN: [id number] Arrival date & time: 06/08/24  1043      History   Chief Complaint Chief Complaint  Patient presents with   Wrist Pain    Small lump on left wrist has some pain to touch and is hard to to the touch - Entered by patient    HPI Linda Hopkins is a 43 y.o. female.   Patient presents to clinic over concern of a lump on the left volar wrist that she has noticed for the past week.  The area is slightly tender to palpation.  Has not had any recent trauma, falls or injuries.  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  Does not do any heavy lifting.  No previous injuries to her wrist.  Has not tried any interventions for the lump.   The history is provided by the patient and medical records.  Wrist Pain    Past Medical History:  Diagnosis Date   Arthritis    oa hips   COVID 09/2020   congestion body aches x 7 days all symtposm reolved   Pelvic pain    Wears dentures    upper   Wears glasses     Patient Active Problem List   Diagnosis Date Noted   Cervical radiculopathy 10/08/2023   Chronic pelvic pain in female 03/24/2021   Hemorrhoids 02/14/2021   COVID-19 virus infection 09/13/2020   Other spondylosis with radiculopathy, cervical region 11/25/2019   Foraminal stenosis of cervical region 11/25/2019   B12 deficiency 09/30/2019   Chronic radicular cervical pain 09/30/2019   Pelvic pain 04/04/2012   BV (bacterial vaginosis) 04/04/2012    Past Surgical History:  Procedure Laterality Date   ANTERIOR CERVICAL DECOMP/DISCECTOMY FUSION N/A 10/08/2023   Procedure: C5-6 ANTERIOR CERVICAL DECOMPRESSION/DISCECTOMY FUSION 1 LEVEL;  Surgeon: Georgina Ozell LABOR, MD;  Location: [facility];  Service: Orthopedics;  Laterality: N/A;   CESAREAN SECTION     x 2   CYSTOSCOPY N/A 03/24/2021   Procedure: CYSTOSCOPY;  Surgeon: Horacio Boas, MD;  Location: [facility];  Service: Gynecology;   Laterality: N/A;   DILATION AND CURETTAGE OF UTERUS  2004   LAPAROSCOPIC BILATERAL SALPINGECTOMY Bilateral 03/24/2021   Procedure: LAPAROSCOPIC BILATERAL SALPINGECTOMY;  Surgeon: Horacio Boas, MD;  Location: [facility] [place];  Service: Gynecology;  Laterality: Bilateral;   LAPAROSCOPIC HYSTERECTOMY N/A 03/24/2021   Procedure: HYSTERECTOMY TOTAL LAPAROSCOPIC; INCISION OF VULVAR LESION ;  Surgeon: Horacio Boas, MD;  Location: [facility];  Service: Gynecology;  Laterality: N/A;   TUBAL LIGATION  2011    OB History     Gravida  3   Para  2   Term  2   Preterm      AB  1   Living  2      SAB      IAB  1   Ectopic      Multiple      Live Births               Home Medications    Prior to Admission medications   Medication Sig Start Date End Date Taking? Authorizing Provider  naproxen  (NAPROSYN ) 500 MG tablet Take 1 tablet (500 mg total) by mouth 2 (two) times daily. 06/08/24  Yes Dreama, Nerine Pulse  N, FNP    Family History Family History  Problem Relation Age of Onset   Cancer Mother    Seizures  Maternal Grandmother    Breast cancer Maternal Grandfather    Seizures Niece     Social History Social History   Tobacco Use   Smoking status: Former    Current packs/day: 0.00    Average packs/day: 0.2 packs/day for 15.0 years (3.0 ttl pk-yrs)    Types: Cigarettes    Quit date: 2002    Years since quitting: 23.6   Smokeless tobacco: Never  Vaping Use   Vaping status: Never Used  Substance Use Topics   Alcohol use: No   Drug use: No     Allergies   Hydrocodone   Review of Systems Review of Systems  Per HPI  Physical Exam Triage Vital Signs ED Triage Vitals  Encounter Vitals Group     BP 06/08/24 1052 108/72     Girls Systolic BP Percentile --      Girls Diastolic BP Percentile --      Boys Systolic BP Percentile --      Boys Diastolic BP Percentile --      Pulse Rate 06/08/24 1052 75     Resp 06/08/24 1052 16      Temp 06/08/24 1052 98.1 F (36.7 C)     Temp Source 06/08/24 1052 Oral     SpO2 06/08/24 1052 98 %     Weight --      Height --      Head Circumference --      Peak Flow --      Pain Score 06/08/24 1053 1     Pain Loc --      Pain Education --      Exclude from Growth Chart --    No data found.  Updated Vital Signs BP 108/72 (BP Location: Right Arm)   Pulse 75   Temp 98.1 F (36.7 C) (Oral)   Resp 16   LMP 02/28/2021   SpO2 98%   Visual Acuity Right Eye Distance:   Left Eye Distance:   Bilateral Distance:    Right Eye Near:   Left Eye Near:    Bilateral Near:     Physical Exam Vitals and nursing note reviewed.  Constitutional:      Appearance: Normal appearance.  HENT:     Head: Normocephalic and atraumatic.     Right Ear: External ear normal.     Left Ear: External ear normal.     Nose: Nose normal.     Mouth/Throat:     Mouth: Mucous membranes are moist.  Eyes:     Conjunctiva/sclera: Conjunctivae normal.  Cardiovascular:     Rate and Rhythm: Normal rate.  Pulmonary:     Effort: Pulmonary effort is normal. No respiratory distress.  Musculoskeletal:     Left wrist: No swelling or deformity. Normal range of motion. Normal pulse.     Comments: 1 cm round cyst to radial aspect of volar wrist   Neurological:     General: No focal deficit present.     Mental Status: She is alert and oriented to person, place, and time.  Psychiatric:        Mood and Affect: Mood normal.        Behavior: Behavior normal. Behavior is cooperative.      UC Treatments / Results  Labs (all labs ordered are listed, but only abnormal results are displayed) Labs Reviewed - No data to display  EKG   Radiology No results found.  Procedures Procedures (including critical care time)  Medications Ordered in UC  Medications - No data to display  Initial Impression / Assessment and Plan / UC Course  I have reviewed the triage vital signs and the nursing notes.  Pertinent  labs & imaging results that were available during my care of the patient were reviewed by me and considered in my medical decision making (see chart for details).  Vitals and triage reviewed, patient is hemodynamically stable. Left wrist w/ full ROM.  Unable to transilluminate the area, could be due to size. Suspect ganglion cyst of wrist, area is TTP.  Atraumatic.  No overlying skin changes, imaging evidence of acute bony abnormality.  Will trial anti-inflammatories, rest and icing.  Orthopedic follow-up encouraged.  Plan of care, follow-up care and return precautions given, no questions at this time.     Final Clinical Impressions(s) / UC Diagnoses   Final diagnoses:  Left wrist pain  Ganglion of left wrist     Discharge Instructions      Your x-ray did not show any bony abnormalities, I believe you have a cyst of the wrist, most likely ganglion cyst which is a collection of fluid.  Take the naproxen  twice daily to help reduce inflammation. Wearing a brace may help as well.   If the cyst grows in size or becomes bothersome please follow-up with an orthopedic, as sometimes they can inject the area with steroids, drain it or surgically remove it.     ED Prescriptions     Medication Sig Dispense Auth. Provider   naproxen  (NAPROSYN ) 500 MG tablet Take 1 tablet (500 mg total) by mouth 2 (two) times daily. 30 tablet Dreama, Kert Shackett  N, FNP      PDMP not reviewed this encounter.   Dreama Phil SAILOR, FNP 06/08/24 1137

## 2024-06-08 NOTE — ED Triage Notes (Signed)
 Pt presents with lump on left wrist for about 1 week. She has some pain with movement and with touch.   Denies known injury

## 2024-06-08 NOTE — Discharge Instructions (Signed)
 Your x-ray did not show any bony abnormalities, I believe you have a cyst of the wrist, most likely ganglion cyst which is a collection of fluid.  Take the naproxen  twice daily to help reduce inflammation. Wearing a brace may help as well.   If the cyst grows in size or becomes bothersome please follow-up with an orthopedic, as sometimes they can inject the area with steroids, drain it or surgically remove it.

## 2024-06-09 ENCOUNTER — Ambulatory Visit: Payer: Self-pay

## 2024-06-26 ENCOUNTER — Ambulatory Visit: Admitting: Orthopedic Surgery

## 2024-06-26 DIAGNOSIS — M67432 Ganglion, left wrist: Secondary | ICD-10-CM

## 2024-06-26 NOTE — Progress Notes (Signed)
 Linda Hopkins - 43 y.o. female MRN [id number]  Date of birth: Apr 11, 1981  Office Visit Note: Visit Date: 06/26/2024 PCP: [place], Betty G, MD Referred by: [place], Betty G, MD  Subjective: No chief complaint on file.  HPI: Linda Hopkins is a pleasant 43 y.o. female who presents today for evaluation of a left wrist volar radial mass that has been present for roughly one month, becoming larger and more painful.  She does have a history of a prior cyst on the knee that was removed years prior. No prior surgery to the left hand or wrist.  States the mass is quite painful in nature, is interested in hearing surgical options.   Pertinent ROS were reviewed with the patient and found to be negative unless otherwise specified above in HPI.   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 Ganglion cyst of volar aspect of left wrist     Plan: Extensive discussion was had with patient today regarding the left wrist mass.  Based on examination, this is presenting as a volar ganglion cyst of the wrist.  We discussed treatment modalities ranging from conservative to surgical.  From a conservative standpoint, we discussed observation, bracing and activity modification as well as anti inflammatory medications topical and oral.  From a surgical standpoint we dicussed surgical excision.   Risks and benefits of the procedure were discussed, risks including but not limited to infection, bleeding, scarring, stiffness, nerve injury, tendon injury, vascular injury, recurrence of symptoms and need for subsequent operation.  We also discussed the appropriate postoperative protocol and timeframe for return to  activities and function.  Patient expressed understanding.  Understanding the above she would like to proceed with surgical scheduling for left wrist volar ganglion cyst excision under general anesthesia.      Follow-up: No follow-ups on file.   Meds & Orders: No orders of the defined types were placed in this encounter.  No orders of the defined types were placed in this encounter.    Procedures: No procedures performed      Clinical History: MRI CERVICAL SPINE WITHOUT CONTRAST   TECHNIQUE: Multiplanar, multisequence MR imaging of the cervical spine was performed. No intravenous contrast was administered.   COMPARISON:  Radiography 03/02/2023.  MRI 09/26/2019.   FINDINGS: Alignment: No malalignment.   Vertebrae: Normal   Cord: No cord compression or focal cord lesion.   Posterior Fossa, vertebral arteries, paraspinal tissues: Normal   Disc levels:   Foramen magnum, C1-2 and C2-3 are normal.   C3-4: Minimal uncovertebral prominence. No canal or foraminal narrowing.   C4-5: Minimal uncovertebral prominence. No canal or foraminal narrowing.   C5-6: Bilateral uncovertebral hypertrophy and posterolateral disc bulges, slightly more prominent on the right than the left. Narrowing of the ventral subarachnoid space but no compression of the cord. AP diameter of the canal in the midline 9.1 mm. Bilateral foraminal stenosis could compress either C6 nerve. Similar appearance to the study of 2020.   C6-7: Bulging of the disc. Mild narrowing of the ventral subarachnoid space but no compressive effect upon the cord or foraminal stenosis.   C7-T1: Normal interspace.   IMPRESSION: 1. C5-6: Bilateral uncovertebral hypertrophy and posterolateral disc bulges, slightly more prominent on the  right than the left. Narrowing of the ventral subarachnoid space but no compression of the cord. Bilateral foraminal stenosis could compress either C6 nerve. Similar appearance to the  study of 2020. 2. C6-7: Mild bulging of the disc without compressive narrowing of the canal or foramina.     Electronically Signed   By: Oneil Officer M.D.   On: 08/05/2023 16:08  She reports that she quit smoking about 23 years ago. Her smoking use included cigarettes. She has a 3 pack-year smoking history. She has never used smokeless tobacco. No results for input(s): HGBA1C, LABURIC in the last 8760 hours.  Objective:   Vital Signs: LMP 02/28/2021   Physical Exam  Gen: Well-appearing, in no acute distress; non-toxic CV: Regular Rate. Well-perfused. Warm.  Resp: Breathing unlabored on room air; no wheezing. Psych: Fluid speech in conversation; appropriate affect; normal thought process  Ortho Exam Left wrist: - volar radial mass just proximal to wrist crease, measures 1.5cm x 2.0cm - moderate tenderness to palpation, soft, mobile, compressible - Allen's test with appropriate refill to both radial and ulnar wrist  - sensation intact to m/r/u - AIN/PIN/IO intact  - wrist ROM flex/ext 65/55, full fist without restriction    Imaging: No results found.  Past Medical/Family/Surgical/Social History: Medications & Allergies reviewed per EMR, new medications updated. Patient Active Problem List   Diagnosis Date Noted   Cervical radiculopathy 10/08/2023   Chronic pelvic pain in female 03/24/2021   Hemorrhoids 02/14/2021   COVID-19 virus infection 09/13/2020   Other spondylosis with radiculopathy, cervical region 11/25/2019   Foraminal stenosis of cervical region 11/25/2019   B12 deficiency 09/30/2019   Chronic radicular cervical pain 09/30/2019   Pelvic pain 04/04/2012   BV (bacterial vaginosis) 04/04/2012   Past Medical History:  Diagnosis Date   Arthritis    oa hips   COVID 09/2020   congestion body aches x 7 days all symtposm reolved   Pelvic pain    Wears dentures    upper   Wears glasses    Family History  Problem Relation Age of Onset   Cancer Mother     Seizures Maternal Grandmother    Breast cancer Maternal Grandfather    Seizures Niece    Past Surgical History:  Procedure Laterality Date   ANTERIOR CERVICAL DECOMP/DISCECTOMY FUSION N/A 10/08/2023   Procedure: C5-6 ANTERIOR CERVICAL DECOMPRESSION/DISCECTOMY FUSION 1 LEVEL;  Surgeon: Georgina Ozell LABOR, MD;  Location: [facility];  Service: Orthopedics;  Laterality: N/A;   CESAREAN SECTION     x 2   CYSTOSCOPY N/A 03/24/2021   Procedure: CYSTOSCOPY;  Surgeon: Horacio Boas, MD;  Location: [facility];  Service: Gynecology;  Laterality: N/A;   DILATION AND CURETTAGE OF UTERUS  2004   LAPAROSCOPIC BILATERAL SALPINGECTOMY Bilateral 03/24/2021   Procedure: LAPAROSCOPIC BILATERAL SALPINGECTOMY;  Surgeon: Horacio Boas, MD;  Location: [facility];  Service: Gynecology;  Laterality: Bilateral;   LAPAROSCOPIC HYSTERECTOMY N/A 03/24/2021   Procedure: HYSTERECTOMY TOTAL LAPAROSCOPIC; INCISION OF VULVAR LESION ;  Surgeon: Horacio Boas, MD;  Location: [facility];  Service: Gynecology;  Laterality: N/A;   TUBAL LIGATION  2011   Social History   Occupational History   Not on file  Tobacco Use   Smoking status: Former    Current packs/day: 0.00    Average packs/day: 0.2 packs/day for 15.0 years (3.0 ttl pk-yrs)    Types: Cigarettes    Quit date: 2002    Years since quitting: 23.6   Smokeless  tobacco: Never  Vaping Use   Vaping status: Never Used  Substance and Sexual Activity   Alcohol use: No   Drug use: No   Sexual activity: Yes    Caetano Oberhaus Estela) Arlinda, M.D. [facility], Hand Surgery

## 2024-06-26 NOTE — H&P (View-Only) (Signed)
 Linda Hopkins - 43 y.o. female MRN [id number]  Date of birth: Apr 11, 1981  Office Visit Note: Visit Date: 06/26/2024 PCP: [place], Betty G, MD Referred by: [place], Betty G, MD  Subjective: No chief complaint on file.  HPI: Linda Hopkins is a pleasant 43 y.o. female who presents today for evaluation of a left wrist volar radial mass that has been present for roughly one month, becoming larger and more painful.  She does have a history of a prior cyst on the knee that was removed years prior. No prior surgery to the left hand or wrist.  States the mass is quite painful in nature, is interested in hearing surgical options.   Pertinent ROS were reviewed with the patient and found to be negative unless otherwise specified above in HPI.   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 Ganglion cyst of volar aspect of left wrist     Plan: Extensive discussion was had with patient today regarding the left wrist mass.  Based on examination, this is presenting as a volar ganglion cyst of the wrist.  We discussed treatment modalities ranging from conservative to surgical.  From a conservative standpoint, we discussed observation, bracing and activity modification as well as anti inflammatory medications topical and oral.  From a surgical standpoint we dicussed surgical excision.   Risks and benefits of the procedure were discussed, risks including but not limited to infection, bleeding, scarring, stiffness, nerve injury, tendon injury, vascular injury, recurrence of symptoms and need for subsequent operation.  We also discussed the appropriate postoperative protocol and timeframe for return to  activities and function.  Patient expressed understanding.  Understanding the above she would like to proceed with surgical scheduling for left wrist volar ganglion cyst excision under general anesthesia.      Follow-up: No follow-ups on file.   Meds & Orders: No orders of the defined types were placed in this encounter.  No orders of the defined types were placed in this encounter.    Procedures: No procedures performed      Clinical History: MRI CERVICAL SPINE WITHOUT CONTRAST   TECHNIQUE: Multiplanar, multisequence MR imaging of the cervical spine was performed. No intravenous contrast was administered.   COMPARISON:  Radiography 03/02/2023.  MRI 09/26/2019.   FINDINGS: Alignment: No malalignment.   Vertebrae: Normal   Cord: No cord compression or focal cord lesion.   Posterior Fossa, vertebral arteries, paraspinal tissues: Normal   Disc levels:   Foramen magnum, C1-2 and C2-3 are normal.   C3-4: Minimal uncovertebral prominence. No canal or foraminal narrowing.   C4-5: Minimal uncovertebral prominence. No canal or foraminal narrowing.   C5-6: Bilateral uncovertebral hypertrophy and posterolateral disc bulges, slightly more prominent on the right than the left. Narrowing of the ventral subarachnoid space but no compression of the cord. AP diameter of the canal in the midline 9.1 mm. Bilateral foraminal stenosis could compress either C6 nerve. Similar appearance to the study of 2020.   C6-7: Bulging of the disc. Mild narrowing of the ventral subarachnoid space but no compressive effect upon the cord or foraminal stenosis.   C7-T1: Normal interspace.   IMPRESSION: 1. C5-6: Bilateral uncovertebral hypertrophy and posterolateral disc bulges, slightly more prominent on the  right than the left. Narrowing of the ventral subarachnoid space but no compression of the cord. Bilateral foraminal stenosis could compress either C6 nerve. Similar appearance to the  study of 2020. 2. C6-7: Mild bulging of the disc without compressive narrowing of the canal or foramina.     Electronically Signed   By: Oneil Officer M.D.   On: 08/05/2023 16:08  She reports that she quit smoking about 23 years ago. Her smoking use included cigarettes. She has a 3 pack-year smoking history. She has never used smokeless tobacco. No results for input(s): HGBA1C, LABURIC in the last 8760 hours.  Objective:   Vital Signs: LMP 02/28/2021   Physical Exam  Gen: Well-appearing, in no acute distress; non-toxic CV: Regular Rate. Well-perfused. Warm.  Resp: Breathing unlabored on room air; no wheezing. Psych: Fluid speech in conversation; appropriate affect; normal thought process  Ortho Exam Left wrist: - volar radial mass just proximal to wrist crease, measures 1.5cm x 2.0cm - moderate tenderness to palpation, soft, mobile, compressible - Allen's test with appropriate refill to both radial and ulnar wrist  - sensation intact to m/r/u - AIN/PIN/IO intact  - wrist ROM flex/ext 65/55, full fist without restriction    Imaging: No results found.  Past Medical/Family/Surgical/Social History: Medications & Allergies reviewed per EMR, new medications updated. Patient Active Problem List   Diagnosis Date Noted   Cervical radiculopathy 10/08/2023   Chronic pelvic pain in female 03/24/2021   Hemorrhoids 02/14/2021   COVID-19 virus infection 09/13/2020   Other spondylosis with radiculopathy, cervical region 11/25/2019   Foraminal stenosis of cervical region 11/25/2019   B12 deficiency 09/30/2019   Chronic radicular cervical pain 09/30/2019   Pelvic pain 04/04/2012   BV (bacterial vaginosis) 04/04/2012   Past Medical History:  Diagnosis Date   Arthritis    oa hips   COVID 09/2020   congestion body aches x 7 days all symtposm reolved   Pelvic pain    Wears dentures    upper   Wears glasses    Family History  Problem Relation Age of Onset   Cancer Mother     Seizures Maternal Grandmother    Breast cancer Maternal Grandfather    Seizures Niece    Past Surgical History:  Procedure Laterality Date   ANTERIOR CERVICAL DECOMP/DISCECTOMY FUSION N/A 10/08/2023   Procedure: C5-6 ANTERIOR CERVICAL DECOMPRESSION/DISCECTOMY FUSION 1 LEVEL;  Surgeon: Georgina Ozell LABOR, MD;  Location: [facility];  Service: Orthopedics;  Laterality: N/A;   CESAREAN SECTION     x 2   CYSTOSCOPY N/A 03/24/2021   Procedure: CYSTOSCOPY;  Surgeon: Horacio Boas, MD;  Location: [facility];  Service: Gynecology;  Laterality: N/A;   DILATION AND CURETTAGE OF UTERUS  2004   LAPAROSCOPIC BILATERAL SALPINGECTOMY Bilateral 03/24/2021   Procedure: LAPAROSCOPIC BILATERAL SALPINGECTOMY;  Surgeon: Horacio Boas, MD;  Location: [facility] [place];  Service: Gynecology;  Laterality: Bilateral;   LAPAROSCOPIC HYSTERECTOMY N/A 03/24/2021   Procedure: HYSTERECTOMY TOTAL LAPAROSCOPIC; INCISION OF VULVAR LESION ;  Surgeon: Horacio Boas, MD;  Location: [facility];  Service: Gynecology;  Laterality: N/A;   TUBAL LIGATION  2011   Social History   Occupational History   Not on file  Tobacco Use   Smoking status: Former    Current packs/day: 0.00    Average packs/day: 0.2 packs/day for 15.0 years (3.0 ttl pk-yrs)    Types: Cigarettes    Quit date: 2002    Years since quitting: 23.6   Smokeless  tobacco: Never  Vaping Use   Vaping status: Never Used  Substance and Sexual Activity   Alcohol use: No   Drug use: No   Sexual activity: Yes    Caetano Oberhaus Estela) Arlinda, M.D. [facility], Hand Surgery

## 2024-06-30 ENCOUNTER — Ambulatory Visit: Admitting: Family Medicine

## 2024-06-30 ENCOUNTER — Ambulatory Visit
Admission: EM | Admit: 2024-06-30 | Discharge: 2024-06-30 | Disposition: A | Discharge status: Home / Self Care | Attending: Internal Medicine | Admitting: Internal Medicine

## 2024-06-30 DIAGNOSIS — H6123 Impacted cerumen, bilateral: Secondary | ICD-10-CM | POA: Diagnosis not present

## 2024-06-30 MED ORDER — CARBAMIDE PEROXIDE 6.5 % OT SOLN: 5.0000 [drp] | Freq: Two times a day (BID) | OTIC | 0 refills | Status: AC

## 2024-06-30 MED ORDER — ALBUTEROL SULFATE HFA 108 (90 BASE) MCG/ACT IN AERS: 2.0000 | INHALATION_SPRAY | Freq: Once | RESPIRATORY_TRACT | Status: DC

## 2024-06-30 MED ORDER — AEROCHAMBER PLUS FLO-VU MEDIUM MISC: 1.0000 | Freq: Once | Status: DC

## 2024-06-30 NOTE — ED Triage Notes (Signed)
 Pt c/o left ear fullness since Thursday. States it became worse after she tried to clean ear. Pt uses bobby pin to get wax out.

## 2024-06-30 NOTE — ED Provider Notes (Signed)
 [facility]    CSN: [id number] Arrival date & time: 06/30/24  1333      History   Chief Complaint Chief Complaint  Patient presents with   Ear Fullness    HPI Linda Hopkins is a 43 y.o. female.   Linda Hopkins is a 43 y.o. female presenting for chief complaint of left ear Fullness that started a few days ago.  Reports decreased hearing from the left ear.  Denies drainage from the left ear canal, tinnitus, fever, chills, neck pain, and viral URI symptoms.  She suspects she may have earwax impacted in the left ear canal and has been using a Bobby pin to try to remove the wax herself without relief.  She has not attempted use of any Debrox or other over-the-counter medications to help with earwax at home.   Ear Fullness    Past Medical History:  Diagnosis Date   Arthritis    oa hips   COVID 09/2020   congestion body aches x 7 days all symtposm reolved   Pelvic pain    Wears dentures    upper   Wears glasses     Patient Active Problem List   Diagnosis Date Noted   Cervical radiculopathy 10/08/2023   Chronic pelvic pain in female 03/24/2021   Hemorrhoids 02/14/2021   COVID-19 virus infection 09/13/2020   Other spondylosis with radiculopathy, cervical region 11/25/2019   Foraminal stenosis of cervical region 11/25/2019   B12 deficiency 09/30/2019   Chronic radicular cervical pain 09/30/2019   Pelvic pain 04/04/2012   BV (bacterial vaginosis) 04/04/2012    Past Surgical History:  Procedure Laterality Date   ANTERIOR CERVICAL DECOMP/DISCECTOMY FUSION N/A 10/08/2023   Procedure: C5-6 ANTERIOR CERVICAL DECOMPRESSION/DISCECTOMY FUSION 1 LEVEL;  Surgeon: Georgina Ozell LABOR, MD;  Location: [facility];  Service: Orthopedics;  Laterality: N/A;   CESAREAN SECTION     x 2   CYSTOSCOPY N/A 03/24/2021   Procedure: CYSTOSCOPY;  Surgeon: Horacio Boas, MD;  Location: [facility];  Service: Gynecology;  Laterality: N/A;   DILATION AND CURETTAGE OF  UTERUS  2004   LAPAROSCOPIC BILATERAL SALPINGECTOMY Bilateral 03/24/2021   Procedure: LAPAROSCOPIC BILATERAL SALPINGECTOMY;  Surgeon: Horacio Boas, MD;  Location: [facility] [place];  Service: Gynecology;  Laterality: Bilateral;   LAPAROSCOPIC HYSTERECTOMY N/A 03/24/2021   Procedure: HYSTERECTOMY TOTAL LAPAROSCOPIC; INCISION OF VULVAR LESION ;  Surgeon: Horacio Boas, MD;  Location: [facility] [place];  Service: Gynecology;  Laterality: N/A;   TUBAL LIGATION  2011    OB History     Gravida  3   Para  2   Term  2   Preterm      AB  1   Living  2      SAB      IAB  1   Ectopic      Multiple      Live Births               Home Medications    Prior to Admission medications   Medication Sig Start Date End Date Taking? Authorizing Provider  carbamide peroxide (DEBROX) 6.5 % OTIC solution Place 5 drops into both ears 2 (two) times daily. 06/30/24  Yes Enedelia Dorna CHRISTELLA, FNP  naproxen  (NAPROSYN ) 500 MG tablet Take 1 tablet (500 mg total) by mouth 2 (two) times daily. 06/08/24   Dreama, [place]  N, FNP    Family History Family History  Problem Relation Age of Onset  Cancer Mother    Seizures Maternal Grandmother    Breast cancer Maternal Grandfather    Seizures Niece     Social History Social History   Tobacco Use   Smoking status: Former    Current packs/day: 0.00    Average packs/day: 0.2 packs/day for 15.0 years (3.0 ttl pk-yrs)    Types: Cigarettes    Quit date: 2002    Years since quitting: 23.6   Smokeless tobacco: Never  Vaping Use   Vaping status: Never Used  Substance Use Topics   Alcohol use: No   Drug use: No     Allergies   Hydrocodone   Review of Systems Review of Systems Per HPI  Physical Exam Triage Vital Signs ED Triage Vitals  Encounter Vitals Group     BP 06/30/24 1411 109/76     Girls Systolic BP Percentile --      Girls Diastolic BP Percentile --      Boys Systolic BP Percentile --      Boys  Diastolic BP Percentile --      Pulse Rate 06/30/24 1411 77     Resp 06/30/24 1411 16     Temp 06/30/24 1411 98.2 F (36.8 C)     Temp Source 06/30/24 1411 Oral     SpO2 06/30/24 1411 98 %     Weight --      Height --      Head Circumference --      Peak Flow --      Pain Score 06/30/24 1412 0     Pain Loc --      Pain Education --      Exclude from Growth Chart --    No data found.  Updated Vital Signs BP 109/76 (BP Location: Right Arm)   Pulse 77   Temp 98.2 F (36.8 C) (Oral)   Resp 16   LMP 02/28/2021   SpO2 98%   Visual Acuity Right Eye Distance:   Left Eye Distance:   Bilateral Distance:    Right Eye Near:   Left Eye Near:    Bilateral Near:     Physical Exam Vitals and nursing note reviewed.  Constitutional:      Appearance: She is not ill-appearing or toxic-appearing.  HENT:     Head: Normocephalic and atraumatic.     Right Ear: Hearing and external ear normal. There is impacted cerumen.     Left Ear: Hearing and external ear normal. There is impacted cerumen.     Nose: Nose normal.     Mouth/Throat:     Lips: Pink.  Eyes:     General: Lids are normal. Vision grossly intact. Gaze aligned appropriately.     Extraocular Movements: Extraocular movements intact.     Conjunctiva/sclera: Conjunctivae normal.  Pulmonary:     Effort: Pulmonary effort is normal.  Musculoskeletal:     Cervical back: Neck supple.  Skin:    General: Skin is warm and dry.     Capillary Refill: Capillary refill takes less than 2 seconds.     Findings: No rash.  Neurological:     General: No focal deficit present.     Mental Status: She is alert and oriented to person, place, and time. Mental status is at baseline.     Cranial Nerves: No dysarthria or facial asymmetry.  Psychiatric:        Mood and Affect: Mood normal.        Speech: Speech normal.  Behavior: Behavior normal.        Thought Content: Thought content normal.        Judgment: Judgment normal.       UC Treatments / Results  Labs (all labs ordered are listed, but only abnormal results are displayed) Labs Reviewed - No data to display  EKG   Radiology No results found.  Procedures Procedures (including critical care time)  Medications Ordered in UC Medications - No data to display  Initial Impression / Assessment and Plan / UC Course  I have reviewed the triage vital signs and the nursing notes.  Pertinent labs & imaging results that were available during my care of the patient were reviewed by me and considered in my medical decision making (see chart for details).   1. Bilateral impacted cerumen Both ear(s) cleaned with ear lavage to remove ear wax impactions bilaterally by nursing staff.  Reassessment shows normal bilateral tympanic membranes without signs of AOE/AOM.  Patient may use debrox ear drops at home as needed for wax removal and has been advised to avoid using Q-tips.   Counseled patient on potential for adverse effects with medications prescribed/recommended today, strict ER and return-to-clinic precautions discussed, patient verbalized understanding.    Final Clinical Impressions(s) / UC Diagnoses   Final diagnoses:  Bilateral impacted cerumen     Discharge Instructions      We flushed out the earwax from your ear(s) today.  Do not use Q-tips as this makes symptoms worse and pushes wax further into the ear canal.  You may use over-the-counter Debrox eardrops as needed for earwax removal at home as needed.  If you develop any new or worsening symptoms or if your symptoms do not start to improve, pleases return here or follow-up with your primary care provider. If your symptoms are severe, please go to the emergency room.      ED Prescriptions     Medication Sig Dispense Auth. Provider   carbamide peroxide (DEBROX) 6.5 % OTIC solution Place 5 drops into both ears 2 (two) times daily. 15 mL Enedelia Dorna HERO, FNP      PDMP not  reviewed this encounter.   Enedelia Dorna HERO, [place] 06/30/24 [phone]

## 2024-06-30 NOTE — Discharge Instructions (Signed)
We flushed out the earwax from your ear(s) today.  Do not use Q-tips as this makes symptoms worse and pushes wax further into the ear canal.  You may use over-the-counter Debrox eardrops as needed for earwax removal at home as needed.  If you develop any new or worsening symptoms or if your symptoms do not start to improve, pleases return here or follow-up with your primary care provider. If your symptoms are severe, please go to the emergency room.

## 2024-07-11 ENCOUNTER — Encounter (HOSPITAL_BASED_OUTPATIENT_CLINIC_OR_DEPARTMENT_OTHER): Payer: Self-pay | Admitting: Orthopedic Surgery

## 2024-07-11 ENCOUNTER — Other Ambulatory Visit: Payer: Self-pay

## 2024-07-16 ENCOUNTER — Telehealth: Payer: Self-pay

## 2024-07-16 NOTE — Telephone Encounter (Signed)
 Called pt to get appointment on 07/31/24 to an earlier time in the day. LMOM

## 2024-07-18 ENCOUNTER — Ambulatory Visit (HOSPITAL_BASED_OUTPATIENT_CLINIC_OR_DEPARTMENT_OTHER): Payer: Self-pay | Admitting: Certified Registered Nurse Anesthetist

## 2024-07-18 ENCOUNTER — Other Ambulatory Visit: Payer: Self-pay

## 2024-07-18 ENCOUNTER — Encounter (HOSPITAL_BASED_OUTPATIENT_CLINIC_OR_DEPARTMENT_OTHER)
Admission: RE | Disposition: A | Discharge status: Home / Self Care | Payer: Self-pay | Source: Home / Self Care | Attending: Orthopedic Surgery

## 2024-07-18 ENCOUNTER — Ambulatory Visit (HOSPITAL_BASED_OUTPATIENT_CLINIC_OR_DEPARTMENT_OTHER)
Admission: RE | Admit: 2024-07-18 | Discharge: 2024-07-18 | Disposition: A | Discharge status: Home / Self Care | Attending: Orthopedic Surgery | Admitting: Orthopedic Surgery

## 2024-07-18 ENCOUNTER — Telehealth: Payer: Self-pay | Admitting: Orthopedic Surgery

## 2024-07-18 ENCOUNTER — Encounter (HOSPITAL_BASED_OUTPATIENT_CLINIC_OR_DEPARTMENT_OTHER): Payer: Self-pay | Admitting: Orthopedic Surgery

## 2024-07-18 DIAGNOSIS — Z87891 Personal history of nicotine dependence: Secondary | ICD-10-CM | POA: Insufficient documentation

## 2024-07-18 DIAGNOSIS — M67432 Ganglion, left wrist: Secondary | ICD-10-CM | POA: Diagnosis not present

## 2024-07-18 HISTORY — PX: GANGLION CYST EXCISION: SHX1691

## 2024-07-18 SURGERY — EXCISION, GANGLION CYST, WRIST
Anesthesia: General | Site: Wrist | Laterality: Left

## 2024-07-18 MED ORDER — CEFAZOLIN SODIUM-DEXTROSE 2-4 GM/100ML-% IV SOLN
2.0000 g | INTRAVENOUS | Status: AC
  Administered 2024-07-18: 2 g via INTRAVENOUS

## 2024-07-18 MED ORDER — OXYCODONE HCL 5 MG PO TABS: 5.0000 mg | ORAL_TABLET | Freq: Once | ORAL | Status: DC | PRN

## 2024-07-18 MED ORDER — DEXAMETHASONE SODIUM PHOSPHATE 10 MG/ML IJ SOLN
INTRAMUSCULAR | Status: DC | PRN
Start: 2024-07-18 — End: 2024-07-18
  Administered 2024-07-18: 10 mg via INTRAVENOUS

## 2024-07-18 MED ORDER — OXYCODONE HCL 5 MG/5ML PO SOLN: 5.0000 mg | Freq: Once | ORAL | Status: DC | PRN

## 2024-07-18 MED ORDER — KETOROLAC TROMETHAMINE 30 MG/ML IJ SOLN
INTRAMUSCULAR | Status: AC
Start: 2024-07-18 — End: 2024-07-18
  Filled 2024-07-18: qty 1

## 2024-07-18 MED ORDER — KETOROLAC TROMETHAMINE 30 MG/ML IJ SOLN
INTRAMUSCULAR | Status: DC | PRN
  Administered 2024-07-18: 30 mg via INTRAVENOUS

## 2024-07-18 MED ORDER — FENTANYL CITRATE (PF) 100 MCG/2ML IJ SOLN
INTRAMUSCULAR | Status: DC | PRN
  Administered 2024-07-18 (×2): 50 ug via INTRAVENOUS

## 2024-07-18 MED ORDER — ONDANSETRON HCL 4 MG/2ML IJ SOLN
INTRAMUSCULAR | Status: DC | PRN
  Administered 2024-07-18: 4 mg via INTRAVENOUS

## 2024-07-18 MED ORDER — LACTATED RINGERS IV SOLN: INTRAVENOUS | Status: DC

## 2024-07-18 MED ORDER — MEPERIDINE HCL 25 MG/ML IJ SOLN: 6.2500 mg | INTRAMUSCULAR | Status: DC | PRN

## 2024-07-18 MED ORDER — SODIUM CHLORIDE 0.9 % IV SOLN: 12.5000 mg | INTRAVENOUS | Status: DC | PRN

## 2024-07-18 MED ORDER — OXYCODONE HCL 5 MG PO TABS: 5.0000 mg | ORAL_TABLET | Freq: Four times a day (QID) | ORAL | 0 refills | Status: AC | PRN

## 2024-07-18 MED ORDER — MIDAZOLAM HCL 5 MG/5ML IJ SOLN
INTRAMUSCULAR | Status: DC | PRN
  Administered 2024-07-18: 2 mg via INTRAVENOUS

## 2024-07-18 MED ORDER — PROPOFOL 10 MG/ML IV BOLUS
INTRAVENOUS | Status: DC | PRN
  Administered 2024-07-18: 170 mg via INTRAVENOUS

## 2024-07-18 MED ORDER — FENTANYL CITRATE (PF) 100 MCG/2ML IJ SOLN
INTRAMUSCULAR | Status: AC
  Filled 2024-07-18: qty 2

## 2024-07-18 MED ORDER — HYDROMORPHONE HCL 1 MG/ML IJ SOLN: 0.2500 mg | INTRAMUSCULAR | Status: DC | PRN

## 2024-07-18 MED ORDER — AMISULPRIDE (ANTIEMETIC) 5 MG/2ML IV SOLN: 10.0000 mg | Freq: Once | INTRAVENOUS | Status: DC | PRN

## 2024-07-18 MED ORDER — 0.9 % SODIUM CHLORIDE (POUR BTL) OPTIME
TOPICAL | Status: DC | PRN
  Administered 2024-07-18: 1000 mL

## 2024-07-18 MED ORDER — CEFAZOLIN SODIUM-DEXTROSE 2-4 GM/100ML-% IV SOLN
INTRAVENOUS | Status: AC
Start: 2024-07-18 — End: 2024-07-18
  Filled 2024-07-18: qty 100

## 2024-07-18 MED ORDER — LIDOCAINE HCL (CARDIAC) PF 100 MG/5ML IV SOSY
PREFILLED_SYRINGE | INTRAVENOUS | Status: DC | PRN
  Administered 2024-07-18: 100 mg via INTRAVENOUS

## 2024-07-18 MED ORDER — MIDAZOLAM HCL 2 MG/2ML IJ SOLN
INTRAMUSCULAR | Status: AC
  Filled 2024-07-18: qty 2

## 2024-07-18 SURGICAL SUPPLY — 39 items
BLADE SURG 15 STRL LF DISP TIS (BLADE) ×4 IMPLANT
BNDG COHESIVE 4X5 TAN STRL LF (GAUZE/BANDAGES/DRESSINGS) ×2 IMPLANT
BNDG COMPR ESMARK 4X3 LF (GAUZE/BANDAGES/DRESSINGS) ×2 IMPLANT
BNDG ELASTIC 4INX 5YD STR LF (GAUZE/BANDAGES/DRESSINGS) ×2 IMPLANT
BNDG GAUZE DERMACEA FLUFF 4 (GAUZE/BANDAGES/DRESSINGS) ×2 IMPLANT
CHLORAPREP W/TINT 26 (MISCELLANEOUS) ×4 IMPLANT
CORD BIPOLAR FORCEPS 12FT (ELECTRODE) ×2 IMPLANT
COVER BACK TABLE 60X90IN (DRAPES) ×2 IMPLANT
CUFF TOURN SGL QUICK 18X4 (TOURNIQUET CUFF) IMPLANT
DERMABOND ADVANCED .7 DNX12 (GAUZE/BANDAGES/DRESSINGS) IMPLANT
DRAPE HAND 77X146 (DRAPES) ×2 IMPLANT
DRAPE SURG 17X23 STRL (DRAPES) ×2 IMPLANT
GAUZE SPONGE 4X4 12PLY STRL (GAUZE/BANDAGES/DRESSINGS) ×2 IMPLANT
GAUZE STRETCH 2X75IN STRL (MISCELLANEOUS) ×2 IMPLANT
GAUZE XEROFORM 1X8 LF (GAUZE/BANDAGES/DRESSINGS) IMPLANT
GLOVE BIO SURGEON STRL SZ7.5 (GLOVE) ×2 IMPLANT
GLOVE BIOGEL PI IND STRL 7.5 (GLOVE) ×2 IMPLANT
GOWN STRL REUS W/ TWL LRG LVL3 (GOWN DISPOSABLE) ×4 IMPLANT
GOWN STRL REUS W/TWL XL LVL3 (GOWN DISPOSABLE) IMPLANT
GOWN STRL SURGICAL XL XLNG (GOWN DISPOSABLE) ×2 IMPLANT
NDL HYPO 25X5/8 SAFETYGLIDE (NEEDLE) IMPLANT
NEEDLE HYPO 25X5/8 SAFETYGLIDE (NEEDLE) IMPLANT
NS IRRIG 1000ML POUR BTL (IV SOLUTION) IMPLANT
PACK BASIN DAY SURGERY FS (CUSTOM PROCEDURE TRAY) ×2 IMPLANT
PADDING CAST ABS COTTON 4X4 ST (CAST SUPPLIES) ×2 IMPLANT
SHEET MEDIUM DRAPE 40X70 STRL (DRAPES) ×2 IMPLANT
SPIKE FLUID TRANSFER (MISCELLANEOUS) IMPLANT
SPLINT PLASTER CAST XFAST 4X15 (CAST SUPPLIES) ×2 IMPLANT
STOCKINETTE IMPERVIOUS 9X36 MD (GAUZE/BANDAGES/DRESSINGS) ×2 IMPLANT
STRIP CLOSURE SKIN 1/2X4 (GAUZE/BANDAGES/DRESSINGS) IMPLANT
SUCTION TUBE FRAZIER 10FR DISP (SUCTIONS) IMPLANT
SUT ETHILON 4 0 PS 2 18 (SUTURE) IMPLANT
SUT MNCRL AB 3-0 PS2 18 (SUTURE) IMPLANT
SUT MNCRL AB 4-0 PS2 18 (SUTURE) IMPLANT
SUT VIC AB 3-0 PS2 18XBRD (SUTURE) IMPLANT
SYR BULB EAR ULCER 3OZ GRN STR (SYRINGE) ×4 IMPLANT
SYR CONTROL 10ML LL (SYRINGE) IMPLANT
TOWEL GREEN STERILE FF (TOWEL DISPOSABLE) ×4 IMPLANT
TUBE CONNECTING 20X1/4 (TUBING) IMPLANT

## 2024-07-18 NOTE — Telephone Encounter (Signed)
 Called pt and let her know the letter is in her chart

## 2024-07-18 NOTE — Interval H&P Note (Signed)
 History and Physical Interval Note:  07/18/2024 11:47 AM  Linda Hopkins  has presented today for surgery, with the diagnosis of LEFT WRIST VOLAR GANGLION CYST.  The various methods of treatment have been discussed with the patient and family. After consideration of risks, benefits and other options for treatment, the patient has consented to  Procedure(s): EXCISION, GANGLION CYST, WRIST (Left) as a surgical intervention.  The patient's history has been reviewed, patient examined, no change in status, stable for surgery.  I have reviewed the patient's chart and labs.  Questions were answered to the patient's satisfaction.     Aliany Fiorenza

## 2024-07-18 NOTE — Anesthesia Preprocedure Evaluation (Addendum)
 Anesthesia Evaluation  Patient identified by MRN, date of birth, ID band Patient awake    Reviewed: Allergy & Precautions, H&P , NPO status , Patient's Chart, lab work & pertinent test results  History of Anesthesia Complications Negative for: history of anesthetic complications  Airway Mallampati: I  TM Distance: >3 FB Neck ROM: Full    Dental  (+) Edentulous Upper, Dental Advisory Given   Pulmonary former smoker   breath sounds clear to auscultation       Cardiovascular (-) hypertension(-) angina (-) Past MI  Rhythm:Regular Rate:Normal     Neuro/Psych negative neurological ROS  negative psych ROS   GI/Hepatic negative GI ROS, Neg liver ROS,,,  Endo/Other  negative endocrine ROS    Renal/GU negative Renal ROS  negative genitourinary   Musculoskeletal negative musculoskeletal ROS (+) Arthritis ,    Abdominal   Peds negative pediatric ROS (+)  Hematology negative hematology ROS (+)   Anesthesia Other Findings   Reproductive/Obstetrics negative OB ROS                              Anesthesia Physical Anesthesia Plan  ASA: 2  Anesthesia Plan: General   Post-op Pain Management: Minimal or no pain anticipated   Induction: Intravenous  PONV Risk Score and Plan: 3 and Ondansetron , Dexamethasone , Midazolam  and Treatment may vary due to age or medical condition  Airway Management Planned: LMA  Additional Equipment: None  Intra-op Plan:   Post-operative Plan: Extubation in OR  Informed Consent: I have reviewed the patients History and Physical, chart, labs and discussed the procedure including the risks, benefits and alternatives for the proposed anesthesia with the patient or authorized representative who has indicated his/her understanding and acceptance.     Dental advisory given  Plan Discussed with: CRNA  Anesthesia Plan Comments:         Anesthesia Quick  Evaluation

## 2024-07-18 NOTE — Discharge Instructions (Addendum)
    Hand Surgery Postop Instructions   Dressings: Maintain postoperative dressing until orthopedic follow-up.  Keep operative site clean and dry until orthopedic follow-up.  Wound Care: Keep your hand elevated above the level of your heart.  Do not allow it to dangle by your side. Moving your fingers is advised to stimulate circulation but will depend on the site of your surgery.  If you have a splint applied, your doctor will advise you regarding movement.  Activity: Do not drive or operate machinery until clearance given from physician. No heavy lifting with operative extremity.  Diet:  Drink liquids today or eat a light diet.  You may resume a regular diet tomorrow.    General expectations: Take prescribed medication if given, transition to over-the-counter medication as quickly as possible. Fingers may become slightly swollen.  Call your doctor if any of the following occur: Severe pain not relieved by pain medication. Elevated temperature. Dressing soaked with blood. Inability to move fingers. White or bluish color to fingers.   Per [facility] clinic policy, our goal is ensure optimal postoperative pain control with a multimodal pain management strategy. For all [facility] patients, our goal is to wean post-operative narcotic medications by 6 weeks post-operatively. If this is not possible due to utilization of pain medication prior to surgery, your [facility] doctor will support your acute post-operative pain control for the first 6 weeks postoperatively, with a plan to transition you back to your primary pain team following that. Maralee will work to ensure a Therapist, occupational.  Anshul Afton Alderton, M.D. Hand Surgery [facility]   May take NSAIDS (Ibuprofen /motrin ) after 6:45 pm, if needed.     Post Anesthesia Home Care Instructions  Activity: Get plenty of rest for the remainder of the day. A responsible individual must stay with you for 24 hours following  the procedure.  For the next 24 hours, DO NOT: -Drive a car -Advertising copywriter -Drink alcoholic beverages -Take any medication unless instructed by your physician -Make any legal decisions or sign important papers.  Meals: Start with liquid foods such as gelatin or soup. Progress to regular foods as tolerated. Avoid greasy, spicy, heavy foods. If nausea and/or vomiting occur, drink only clear liquids until the nausea and/or vomiting subsides. Call your physician if vomiting continues.  Special Instructions/Symptoms: Your throat may feel dry or sore from the anesthesia or the breathing tube placed in your throat during surgery. If this causes discomfort, gargle with warm salt water. The discomfort should disappear within 24 hours.  If you had a scopolamine  patch placed behind your ear for the management of post- operative nausea and/or vomiting:  1. The medication in the patch is effective for 72 hours, after which it should be removed.  Wrap patch in a tissue and discard in the trash. Wash hands thoroughly with soap and water. 2. You may remove the patch earlier than 72 hours if you experience unpleasant side effects which may include dry mouth, dizziness or visual disturbances. 3. Avoid touching the patch. Wash your hands with soap and water after contact with the patch.

## 2024-07-18 NOTE — Transfer of Care (Signed)
 Immediate Anesthesia Transfer of Care Note  Patient: Linda Hopkins  Procedure(s) Performed: EXCISION, GANGLION CYST, WRIST (Left: Wrist)  Patient Location: PACU  Anesthesia Type:General  Level of Consciousness: awake, alert , and oriented  Airway & Oxygen Therapy: Patient Spontanous Breathing and Patient connected to face mask oxygen  Post-op Assessment: Report given to RN and Post -op Vital signs reviewed and stable  Post vital signs: Reviewed and stable  Last Vitals:  Vitals Value Taken Time  BP    Temp    Pulse    Resp    SpO2      Last Pain:  Vitals:   07/18/24 1126  TempSrc: Temporal  PainSc: 0-No pain      Patients Stated Pain Goal: 3 (07/18/24 1126)  Complications: No notable events documented.

## 2024-07-18 NOTE — Anesthesia Procedure Notes (Signed)
 Procedure Name: LMA Insertion Date/Time: 07/18/2024 11:58 AM  Performed by: Frost Kayla MATSU, CRNAPre-anesthesia Checklist: Patient identified, Emergency Drugs available, Suction available and Patient being monitored Patient Re-evaluated:Patient Re-evaluated prior to induction Oxygen Delivery Method: Circle system utilized Preoxygenation: Pre-oxygenation with 100% oxygen Induction Type: IV induction LMA: LMA inserted LMA Size: 4.0 Number of attempts: 1 Placement Confirmation: positive ETCO2 Tube secured with: Tape Dental Injury: Teeth and Oropharynx as per pre-operative assessment

## 2024-07-18 NOTE — Telephone Encounter (Signed)
 Patient called would like a note to be out of work Monday 07/21/24

## 2024-07-18 NOTE — Anesthesia Postprocedure Evaluation (Signed)
 Anesthesia Post Note  Patient: Linda Hopkins  Procedure(s) Performed: EXCISION, GANGLION CYST, WRIST (Left: Wrist)     Patient location during evaluation: PACU Anesthesia Type: General Level of consciousness: awake and alert Pain management: pain level controlled Vital Signs Assessment: post-procedure vital signs reviewed and stable Respiratory status: spontaneous breathing, nonlabored ventilation, respiratory function stable and patient connected to nasal cannula oxygen Cardiovascular status: blood pressure returned to baseline and stable Postop Assessment: no apparent nausea or vomiting Anesthetic complications: no   No notable events documented.  Last Vitals:  Vitals:   07/18/24 1315 07/18/24 1320  BP: 101/70 108/68  Pulse: 76 70  Resp: 16 14  Temp:  (!) 36.3 C  SpO2: 98% 98%    Last Pain:  Vitals:   07/18/24 1320  TempSrc:   PainSc: 0-No pain                 Linda Hopkins

## 2024-07-18 NOTE — Op Note (Signed)
 NAME: PARTHENIA TELLEFSEN MEDICAL RECORD NO: [id number] DATE OF BIRTH: 1981-06-22 FACILITY: [facility] LOCATION: [facility] PHYSICIAN: GILDARDO ALDERTON, MD   OPERATIVE REPORT   DATE OF PROCEDURE: 07/18/24    PREOPERATIVE DIAGNOSIS: Left wrist volar ganglion cyst   POSTOPERATIVE DIAGNOSIS: Left wrist volar ganglion cyst   PROCEDURE: Left wrist volar ganglion cyst   SURGEON:  GILDARDO ALDERTON, M.D.   ASSISTANT: None   ANESTHESIA:  General   INTRAVENOUS FLUIDS:  Per anesthesia flow sheet.   ESTIMATED BLOOD LOSS:  Minimal.   COMPLICATIONS:  None.   SPECIMENS: Cyst excision sent for permanent specimen   TOURNIQUET TIME: 10 minutes   DISPOSITION:  Stable to PACU.   INDICATIONS: 43 year old female with history of left wrist volar ganglion cyst that was symptomatic in nature.  Patient was indicated for left wrist volar ganglion cyst excision.  Risks and benefits of surgery were discussed including the risks of infection, bleeding, scarring, stiffness, nerve injury, vascular injury, tendon injury, need for subsequent operation, recurrence.  She voiced understanding of these risks and elected to proceed.  OPERATIVE COURSE: Patient was seen and identified in the preoperative area and marked appropriately.  Surgical consent had been signed. Preoperative IV antibiotic prophylaxis was given. She was transferred to the operating room and placed in supine position with the Left upper extremity on an arm board.  General anesthesia was induced by the anesthesiologist.  Left upper extremity was prepped and draped in normal sterile orthopedic fashion.  A surgical pause was performed between the surgeons, anesthesia, and operating room staff and all were in agreement as to the patient, procedure, and site of procedure.  Tourniquet was placed and padded appropriately to the left upper arm.  Longitudinal incision was designed over the volar radial aspect of the wrist.  Incision was carried  down utilizing a 15 blade.  Blunt dissection was performed, taking care to protect underlying neurovascular structures.  Radial artery was identified and protected in its entirety.  Cyst was carefully dissected and noted to be lobulated around the radial artery region, cyst carefully peeled off the vascular structures.  Stalk was then identified and traced down to the level of the radiocarpal joint.  Both cyst and its stalk were removed in its entirety and sent for surgical specimen.   The tourniquet was deflated at 10 minutes.  Fingertips were pink with brisk capillary refill after deflation of tourniquet.  Both radial and ulnar collateral circulation was noted to be appropriate at the wrist level.  Copious irrigation was performed.  Layered closure was performed lysing combination of 3-0 Monocryl for the subcutaneous layer and 4-0 Monocryl for the skin surface.  Dermabond and Steri-Strips were applied.  Sterile dressings were placed followed by application of a volar slab splint utilizing plaster.  The operative drapes were broken down.  The patient was awoken from anesthesia safely and taken to PACU in stable condition.  I will see her back in the office in  2 weeks  for postoperative followup.     Post-operative plan: The patient will recover in the post-anesthesia care unit and then be discharged home.  The patient will be non weight bearing on the left upper extremity in a short arm volar splint.   I will see the patient back in the office in 2 weeks for postoperative followup.    Rayshon Albaugh, MD Electronically signed, 07/18/24

## 2024-07-19 ENCOUNTER — Encounter (HOSPITAL_BASED_OUTPATIENT_CLINIC_OR_DEPARTMENT_OTHER): Payer: Self-pay | Admitting: Orthopedic Surgery

## 2024-07-21 LAB — SURGICAL PATHOLOGY

## 2024-07-31 ENCOUNTER — Ambulatory Visit (INDEPENDENT_AMBULATORY_CARE_PROVIDER_SITE_OTHER): Admitting: Orthopedic Surgery

## 2024-07-31 DIAGNOSIS — Z9889 Other specified postprocedural states: Secondary | ICD-10-CM

## 2024-07-31 NOTE — Progress Notes (Signed)
   Linda Hopkins - 43 y.o. female MRN [id number]  Date of birth: July 17, 1981  Office Visit Note: Visit Date: 07/31/2024 PCP: [place], Betty G, MD Referred by: [place], Betty G, MD  Subjective:  HPI: Linda Hopkins is a 43 y.o. female who presents today for follow up 2 weeks status post Left wrist volar ganglion cyst excision.  Pathology confirms ganglion cyst.  Doing well overall, pain is controlled.  Pertinent ROS were reviewed with the patient and found to be negative unless otherwise specified above in HPI.   Assessment & Plan: Visit Diagnoses:  1. S/P excision of ganglion cyst     Plan: She is doing very well overall.  Pathology was reviewed in detail today.  Suture tails were trimmed.  Removable wrist brace was given.  Can begin gradual range of motion exercises of the hand and wrist.  Will place therapy order for occupational therapy exercises and transition to home exercise program when appropriate.  Follow-up in 4 weeks.  Follow-up: No follow-ups on file.   Meds & Orders: No orders of the defined types were placed in this encounter.  No orders of the defined types were placed in this encounter.    Procedures: No procedures performed       Objective:   Vital Signs: LMP 02/28/2021   Ortho Exam Left wrist with well-healed volar incision, suture tails trimmed today, no erythema or drainage, wrist range of motion flexion extension 25/15 without significant pain, no evidence of recurrent mass, hand remains warm well-perfused, sensation intact all distributions M/R/U  Imaging: No results found.   Rozena Fierro Afton Alderton, M.D. [facility], Hand Surgery

## 2024-08-22 ENCOUNTER — Encounter (HOSPITAL_BASED_OUTPATIENT_CLINIC_OR_DEPARTMENT_OTHER): Payer: Self-pay | Admitting: Orthopedic Surgery

## 2024-08-26 ENCOUNTER — Ambulatory Visit: Admitting: Orthopedic Surgery

## 2024-08-26 DIAGNOSIS — Z9889 Other specified postprocedural states: Secondary | ICD-10-CM

## 2024-08-26 DIAGNOSIS — M67432 Ganglion, left wrist: Secondary | ICD-10-CM

## 2024-08-26 NOTE — Progress Notes (Signed)
   Linda Hopkins - 43 y.o. female MRN [id number]  Date of birth: 12/30/1980  Office Visit Note: Visit Date: 08/26/2024 PCP: [place], Betty G, MD Referred by: [place], Betty G, MD  Subjective:  HPI: Linda Hopkins is a 43 y.o. female who presents today for follow up 5 weeks status post Left wrist volar ganglion cyst excision.  She is doing very well overall, pain is controlled, has resumed activities as tolerated.  Occasionally utilizing the wrist brace while at work.  Pertinent ROS were reviewed with the patient and found to be negative unless otherwise specified above in HPI.   Assessment & Plan: Visit Diagnoses:  No diagnosis found.   Plan: She is doing very well overall.  Continue with activities as tolerated moving forward.  We once again discussed the possibility of cyst recurrence in the future, she expressed full understanding.  Will return as needed  Follow-up: No follow-ups on file.   Meds & Orders: No orders of the defined types were placed in this encounter.  No orders of the defined types were placed in this encounter.    Procedures: No procedures performed       Objective:   Vital Signs: LMP 02/28/2021   Ortho Exam Left wrist with well-healed volar incision, no erythema or drainage, wrist range of motion flexion extension 55/45 without significant pain, no evidence of recurrent mass, hand remains warm well-perfused, sensation intact all distributions M/R/U  Imaging: No results found.   Linda Hopkins, M.D. [facility], Hand Surgery

## 2024-09-01 ENCOUNTER — Ambulatory Visit
Admission: RE | Admit: 2024-09-01 | Discharge: 2024-09-01 | Disposition: A | Discharge status: Home / Self Care | Attending: Emergency Medicine | Admitting: Emergency Medicine

## 2024-09-01 ENCOUNTER — Other Ambulatory Visit: Payer: Self-pay

## 2024-09-01 ENCOUNTER — Ambulatory Visit
Admission: RE | Admit: 2024-09-01 | Discharge: 2024-09-01 | Disposition: A | Discharge status: Home / Self Care | Source: Ambulatory Visit

## 2024-09-01 DIAGNOSIS — J069 Acute upper respiratory infection, unspecified: Secondary | ICD-10-CM

## 2024-09-01 DIAGNOSIS — H65192 Other acute nonsuppurative otitis media, left ear: Secondary | ICD-10-CM

## 2024-09-01 MED ORDER — AMOXICILLIN-POT CLAVULANATE 875-125 MG PO TABS: 1.0000 | ORAL_TABLET | Freq: Two times a day (BID) | ORAL | 0 refills | Status: AC

## 2024-09-01 NOTE — Discharge Instructions (Signed)
 Please take antibiotic Augmentin as prescribed. Take with food to avoid upset stomach. Finish the full course - you should not have any leftover!  I also recommend using ibuprofen  and/or tylenol  for pain or fever  Drink lots of fluids!

## 2024-09-01 NOTE — ED Triage Notes (Addendum)
 Pt presents with a chief complaint of left ear pain x 2 days. States this is accompanied with chills, nasal congestion, and cough. Unsure of fevers at home. Currently rates overall left ear pain a 7/10 at this time. No medications taken PTA for symptoms reported. Denies sick contacts.

## 2024-09-01 NOTE — ED Provider Notes (Signed)
 [facility]    CSN: [id number] Arrival date & time: 09/01/24  1339      History   Chief Complaint Chief Complaint  Patient presents with   Otalgia    HPI Linda Hopkins is a 43 y.o. female.  2-3 day history of left ear pain.  Rated 7/10 Also a little bit of runny nose and scratchy throat.  Denies fever, chills, cough No hearing changes  Has not attempted any interventions yet.  Possible sick contacts at work  Past Medical History:  Diagnosis Date   Arthritis    oa hips   COVID 09/2020   congestion body aches x 7 days all symtposm reolved   Pelvic pain    Wears dentures    upper   Wears glasses     Patient Active Problem List   Diagnosis Date Noted   Ganglion cyst of volar aspect of left wrist 07/18/2024   Cervical radiculopathy 10/08/2023   Chronic pelvic pain in female 03/24/2021   Hemorrhoids 02/14/2021   COVID-19 virus infection 09/13/2020   Other spondylosis with radiculopathy, cervical region 11/25/2019   Foraminal stenosis of cervical region 11/25/2019   B12 deficiency 09/30/2019   Chronic radicular cervical pain 09/30/2019   Pelvic pain 04/04/2012   BV (bacterial vaginosis) 04/04/2012    Past Surgical History:  Procedure Laterality Date   ANTERIOR CERVICAL DECOMP/DISCECTOMY FUSION N/A 10/08/2023   Procedure: C5-6 ANTERIOR CERVICAL DECOMPRESSION/DISCECTOMY FUSION 1 LEVEL;  Surgeon: Georgina Ozell LABOR, MD;  Location: [facility] OR;  Service: Orthopedics;  Laterality: N/A;   CESAREAN SECTION     x 2   CYSTOSCOPY N/A 03/24/2021   Procedure: CYSTOSCOPY;  Surgeon: Horacio Boas, MD;  Location: [facility];  Service: Gynecology;  Laterality: N/A;   DILATION AND CURETTAGE OF UTERUS  2004   GANGLION CYST EXCISION Left 07/18/2024   Procedure: EXCISION, GANGLION CYST, WRIST;  Surgeon: Arlinda Buster, MD;  Location: [facility];  Service: Orthopedics;  Laterality: Left;   LAPAROSCOPIC BILATERAL SALPINGECTOMY Bilateral  03/24/2021   Procedure: LAPAROSCOPIC BILATERAL SALPINGECTOMY;  Surgeon: Horacio Boas, MD;  Location: [facility] [place];  Service: Gynecology;  Laterality: Bilateral;   LAPAROSCOPIC HYSTERECTOMY N/A 03/24/2021   Procedure: HYSTERECTOMY TOTAL LAPAROSCOPIC; INCISION OF VULVAR LESION ;  Surgeon: Horacio Boas, MD;  Location: [facility];  Service: Gynecology;  Laterality: N/A;   TUBAL LIGATION  2011    OB History     Gravida  3   Para  2   Term  2   Preterm      AB  1   Living  2      SAB      IAB  1   Ectopic      Multiple      Live Births               Home Medications    Prior to Admission medications   Medication Sig Start Date End Date Taking? Authorizing Provider  amoxicillin-clavulanate (AUGMENTIN) 875-125 MG tablet Take 1 tablet by mouth every 12 (twelve) hours for 7 days. 09/01/24 09/08/24 Yes Cloyd Ragas, Asberry, PA-C  carbamide peroxide (DEBROX) 6.5 % OTIC solution Place 5 drops into both ears 2 (two) times daily. 06/30/24   Enedelia Dorna CHRISTELLA, FNP  oxyCODONE  (ROXICODONE ) 5 MG immediate release tablet Take 1 tablet (5 mg total) by mouth every 6 (six) hours as needed. 07/18/24 07/18/25  Arlinda Buster, MD    Family History Family History  Problem Relation Age of Onset   Cancer Mother    Seizures Maternal Grandmother    Breast cancer Maternal Grandfather    Seizures Niece     Social History Social History   Tobacco Use   Smoking status: Former    Current packs/day: 0.00    Average packs/day: 0.2 packs/day for 15.0 years (3.0 ttl pk-yrs)    Types: Cigarettes    Quit date: 2002    Years since quitting: 23.8   Smokeless tobacco: Never  Vaping Use   Vaping status: Never Used  Substance Use Topics   Alcohol use: No   Drug use: No     Allergies   Hydrocodone   Review of Systems Review of Systems  HENT:  Positive for ear pain.    As per HPI  Physical Exam Triage Vital Signs ED Triage Vitals  Encounter  Vitals Group     BP 09/01/24 1431 95/66     Girls Systolic BP Percentile --      Girls Diastolic BP Percentile --      Boys Systolic BP Percentile --      Boys Diastolic BP Percentile --      Pulse Rate 09/01/24 1431 92     Resp 09/01/24 1431 17     Temp 09/01/24 1431 98.4 F (36.9 C)     Temp Source 09/01/24 1431 Oral     SpO2 09/01/24 1431 97 %     Weight 09/01/24 1430 150 lb (68 kg)     Height 09/01/24 1430 5' 2 (1.575 m)     Head Circumference --      Peak Flow --      Pain Score 09/01/24 1429 7     Pain Loc --      Pain Education --      Exclude from Growth Chart --    No data found.  Updated Vital Signs BP 100/72 (BP Location: Right Arm)   Pulse 92   Temp 98.4 F (36.9 C) (Oral)   Resp 17   Ht 5' 2 (1.575 m)   Wt 150 lb (68 kg)   LMP 02/28/2021   SpO2 97%   BMI 27.44 kg/m   Physical Exam Vitals and nursing note reviewed.  Constitutional:      General: She is not in acute distress.    Appearance: She is not ill-appearing or diaphoretic.  HENT:     Right Ear: Tympanic membrane and ear canal normal.     Left Ear: Tenderness (tragal) present. Tympanic membrane is injected and erythematous.     Nose: No congestion or rhinorrhea.     Mouth/Throat:     Mouth: Mucous membranes are moist.     Pharynx: Oropharynx is clear. No oropharyngeal exudate or posterior oropharyngeal erythema.     Tonsils: 0 on the right. 0 on the left.  Eyes:     Conjunctiva/sclera: Conjunctivae normal.  Cardiovascular:     Rate and Rhythm: Normal rate and regular rhythm.     Pulses: Normal pulses.     Heart sounds: Normal heart sounds.  Pulmonary:     Effort: Pulmonary effort is normal.     Breath sounds: Normal breath sounds.  Abdominal:     Palpations: Abdomen is soft.     Tenderness: There is no abdominal tenderness.  Musculoskeletal:        General: Normal range of motion.     Cervical back: Normal range of motion. No rigidity or tenderness.  Lymphadenopathy:  Cervical:  No cervical adenopathy.  Skin:    General: Skin is warm and dry.  Neurological:     Mental Status: She is alert and oriented to person, place, and time.     UC Treatments / Results  Labs (all labs ordered are listed, but only abnormal results are displayed) Labs Reviewed - No data to display  EKG   Radiology No results found.  Procedures Procedures (including critical care time)  Medications Ordered in UC Medications - No data to display  Initial Impression / Assessment and Plan / UC Course  I have reviewed the triage vital signs and the nursing notes.  Pertinent labs & imaging results that were available during my care of the patient were reviewed by me and considered in my medical decision making (see chart for details).  Afebrile, well appearing  Left otitis media Augmentin BID x 7 days. Other supportive care and OTC treatments advised. Monitor symptoms, can return if needed. Note for work provided  Final Clinical Impressions(s) / UC Diagnoses   Final diagnoses:  Other non-recurrent acute nonsuppurative otitis media of left ear  Viral URI     Discharge Instructions      Please take antibiotic Augmentin as prescribed. Take with food to avoid upset stomach. Finish the full course - you should not have any leftover!  I also recommend using ibuprofen  and/or tylenol  for pain or fever  Drink lots of fluids!    ED Prescriptions     Medication Sig Dispense Auth. Provider   amoxicillin-clavulanate (AUGMENTIN) 875-125 MG tablet Take 1 tablet by mouth every 12 (twelve) hours for 7 days. 14 tablet Jaslin Novitski, Asberry, PA-C      PDMP not reviewed this encounter.   Jeryl Asberry, PA-C 09/01/24 1514

## 2024-09-08 ENCOUNTER — Encounter: Payer: Self-pay | Admitting: Radiology
